# Patient Record
Sex: Male | Born: 1965 | Race: White | Hispanic: No | Smoking: Never smoker
Health system: Southern US, Community
[De-identification: ages and names within clinical notes are randomized; demographics above are authoritative.]

## PROBLEM LIST (undated history)

## (undated) DIAGNOSIS — R454 Irritability and anger: Secondary | ICD-10-CM

## (undated) DIAGNOSIS — J9819 Other pulmonary collapse: Secondary | ICD-10-CM

## (undated) DIAGNOSIS — F329 Major depressive disorder, single episode, unspecified: Secondary | ICD-10-CM

## (undated) DIAGNOSIS — F32A Depression, unspecified: Secondary | ICD-10-CM

## (undated) DIAGNOSIS — K219 Gastro-esophageal reflux disease without esophagitis: Secondary | ICD-10-CM

## (undated) HISTORY — DX: Gastro-esophageal reflux disease without esophagitis: K21.9

## (undated) HISTORY — DX: Depression, unspecified: F32.A

## (undated) HISTORY — DX: Major depressive disorder, single episode, unspecified: F32.9

## (undated) HISTORY — PX: PUNCH BIOPSY OF SKIN: SHX6390

## (undated) SURGERY — Surgical Case
Anesthesia: *Unknown

---

## 1998-12-30 ENCOUNTER — Encounter: Payer: Self-pay | Admitting: Family Medicine

## 1998-12-30 ENCOUNTER — Ambulatory Visit (HOSPITAL_COMMUNITY): Admission: RE | Admit: 1998-12-30 | Discharge: 1998-12-30 | Payer: Self-pay | Admitting: *Deleted

## 2004-10-11 ENCOUNTER — Encounter: Admission: RE | Admit: 2004-10-11 | Discharge: 2004-10-11 | Payer: Self-pay | Admitting: Family Medicine

## 2004-11-22 LAB — HM COLONOSCOPY

## 2006-10-16 ENCOUNTER — Ambulatory Visit: Payer: Self-pay | Admitting: Family Medicine

## 2007-12-19 ENCOUNTER — Ambulatory Visit: Payer: Self-pay | Admitting: Family Medicine

## 2007-12-31 ENCOUNTER — Ambulatory Visit: Payer: Self-pay | Admitting: Family Medicine

## 2009-03-01 ENCOUNTER — Ambulatory Visit: Payer: Self-pay | Admitting: Family Medicine

## 2009-03-01 ENCOUNTER — Encounter: Payer: Self-pay | Admitting: Cardiology

## 2009-03-31 ENCOUNTER — Encounter (INDEPENDENT_AMBULATORY_CARE_PROVIDER_SITE_OTHER): Payer: Self-pay | Admitting: *Deleted

## 2010-06-03 ENCOUNTER — Ambulatory Visit: Payer: Self-pay | Admitting: Family Medicine

## 2010-07-28 ENCOUNTER — Ambulatory Visit: Payer: Self-pay | Admitting: Family Medicine

## 2010-10-02 ENCOUNTER — Encounter: Payer: Self-pay | Admitting: Family Medicine

## 2010-10-12 ENCOUNTER — Ambulatory Visit: Payer: Self-pay | Admitting: Physician Assistant

## 2011-03-28 ENCOUNTER — Telehealth: Payer: Self-pay | Admitting: Family Medicine

## 2011-03-28 MED ORDER — SERTRALINE HCL 50 MG PO TABS: ORAL_TABLET | ORAL | Status: DC

## 2011-03-28 NOTE — Telephone Encounter (Signed)
Contacted [facility] said pt had a script from Sept. Last year From [id number] with 3 refills he picked up last refill June 17 th He also had gotten the one from Dr. Susann Givens on April 4 th

## 2011-03-28 NOTE — Telephone Encounter (Signed)
Medication was called in but he needs an appointment in about a month

## 2011-05-07 ENCOUNTER — Other Ambulatory Visit: Payer: Self-pay | Admitting: Family Medicine

## 2011-05-08 NOTE — Telephone Encounter (Signed)
Patient needs an appointment. Med refill was refused

## 2011-05-08 NOTE — Telephone Encounter (Signed)
Is this ok?

## 2011-05-12 ENCOUNTER — Other Ambulatory Visit: Payer: Self-pay | Admitting: Family Medicine

## 2011-05-16 ENCOUNTER — Telehealth: Payer: Self-pay

## 2011-05-16 NOTE — Telephone Encounter (Signed)
LEFT MESSAGE PT NEEDS APPT

## 2011-05-16 NOTE — Telephone Encounter (Signed)
Is this ok?

## 2011-05-16 NOTE — Telephone Encounter (Signed)
He needs an appointment

## 2011-05-29 ENCOUNTER — Encounter: Payer: Self-pay | Admitting: Family Medicine

## 2011-05-29 ENCOUNTER — Ambulatory Visit (INDEPENDENT_AMBULATORY_CARE_PROVIDER_SITE_OTHER): Payer: PRIVATE HEALTH INSURANCE | Admitting: Family Medicine

## 2011-05-29 VITALS — BP 126/76 | HR 77 | Ht 71.0 in | Wt 221.0 lb

## 2011-05-29 DIAGNOSIS — Z23 Encounter for immunization: Secondary | ICD-10-CM

## 2011-05-29 DIAGNOSIS — F341 Dysthymic disorder: Secondary | ICD-10-CM

## 2011-05-29 DIAGNOSIS — E669 Obesity, unspecified: Secondary | ICD-10-CM

## 2011-05-29 DIAGNOSIS — N433 Hydrocele, unspecified: Secondary | ICD-10-CM | POA: Insufficient documentation

## 2011-05-29 DIAGNOSIS — I10 Essential (primary) hypertension: Secondary | ICD-10-CM

## 2011-05-29 DIAGNOSIS — Z0000 Encounter for general adult medical examination without abnormal findings: Secondary | ICD-10-CM

## 2011-05-29 DIAGNOSIS — Z8719 Personal history of other diseases of the digestive system: Secondary | ICD-10-CM

## 2011-05-29 LAB — POCT URINALYSIS DIPSTICK
Bilirubin, UA: NEGATIVE
Blood, UA: NEGATIVE
Glucose, UA: NEGATIVE
Ketones, UA: NEGATIVE
Leukocytes, UA: NEGATIVE
Nitrite, UA: NEGATIVE
Protein, UA: NEGATIVE
Spec Grav, UA: 1.02
Urobilinogen, UA: NEGATIVE
pH, UA: 5

## 2011-05-29 LAB — LIPID PANEL
Cholesterol: 164 mg/dL (ref 0–200)
Triglycerides: 76 mg/dL (ref <150)
VLDL: 15 mg/dL (ref 0–40)

## 2011-05-29 LAB — CBC WITH DIFFERENTIAL/PLATELET
Eosinophils Absolute: 0.1 10*3/uL (ref 0.0–0.7)
Hemoglobin: 15.8 g/dL (ref 13.0–17.0)
Lymphocytes Relative: 30 % (ref 12–46)
Lymphs Abs: 1.9 10*3/uL (ref 0.7–4.0)
MCH: 28.9 pg (ref 26.0–34.0)
Monocytes Relative: 3 % (ref 3–12)
Neutro Abs: 4.2 10*3/uL (ref 1.7–7.7)
Neutrophils Relative %: 66 % (ref 43–77)
Platelets: 216 10*3/uL (ref 150–400)
RBC: 5.47 MIL/uL (ref 4.22–5.81)
WBC: 6.4 10*3/uL (ref 4.0–10.5)

## 2011-05-29 LAB — COMPREHENSIVE METABOLIC PANEL
ALT: 21 U/L (ref 0–53)
Albumin: 4.8 g/dL (ref 3.5–5.2)
CO2: 26 mEq/L (ref 19–32)
Calcium: 9.5 mg/dL (ref 8.4–10.5)
Chloride: 106 mEq/L (ref 96–112)
Glucose, Bld: 114 mg/dL — ABNORMAL HIGH (ref 70–99)
Potassium: 4.1 mEq/L (ref 3.5–5.3)
Sodium: 141 mEq/L (ref 135–145)
Total Protein: 7 g/dL (ref 6.0–8.3)

## 2011-05-29 MED ORDER — SERTRALINE HCL 50 MG PO TABS: ORAL_TABLET | ORAL | Status: DC

## 2011-05-29 MED ORDER — LISINOPRIL-HYDROCHLOROTHIAZIDE 10-12.5 MG PO TABS: 1.0000 | ORAL_TABLET | Freq: Every day | ORAL | Status: DC

## 2011-05-29 NOTE — Progress Notes (Signed)
  Subjective:    Patient ID: Robert Diaz, male    DOB: 15-Sep-1965, 45 y.o.   MRN: [id number]  HPI He is here for complete examination. Approximately 2 weeks ago he woke up with some back pain and is still having some difficulty with this. No history of injury. No numbness tingling or weakness. He continues on his blood pressure medicine and Zoloft. He is having no difficulty with this other than when he forgets the Zoloft, he will become more irritable. He does chew tobacco roughly once per month. His social and family history are reviewed   Review of Systems  Cardiovascular: Positive for palpitations.  Genitourinary: Positive for scrotal swelling.  Musculoskeletal: Positive for myalgias.  Hematological: Negative.   Psychiatric/Behavioral: Negative.        Objective:   Physical Exam BP 126/76  Pulse 77  Ht 5\' 11"  (1.803 m)  Wt 221 lb (100.245 kg)  BMI 30.82 kg/m2  General Appearance:    Alert, cooperative, no distress, appears stated age  Head:    Normocephalic, without obvious abnormality, atraumatic  Eyes:    PERRL, conjunctiva/corneas clear, EOM's intact, fundi    benign  Ears:    Normal TM's and external ear canals  Nose:   Nares normal, mucosa normal, no drainage or sinus   tenderness  Throat:   Lips, mucosa, and tongue normal; teeth and gums normal  Neck:   Supple, no lymphadenopathy;  thyroid:  no   enlargement/tenderness/nodules; no carotid   bruit or JVD  Back:    Spine nontender, no curvature, ROM normal, no CVA     tenderness  Lungs:     Clear to auscultation bilaterally without wheezes, rales or     ronchi; respirations unlabored  Chest Wall:    No tenderness or deformity   Heart:    Regular rate and rhythm, S1 and S2 normal, no murmur, rub   or gallop  Breast Exam:    No chest wall tenderness, masses or gynecomastia  Abdomen:     Soft, non-tender, nondistended, normoactive bowel sounds,    no masses, no hepatosplenomegaly  Genitalia:    Normal male external  genitalia without lesions.  Testicles without masses, right-sided to cm mass noted; it does transilluminate.  No inguinal hernias.  Rectal:    Normal sphincter tone, no masses or tenderness; guaiac negative stool.  Prostate smooth, no nodules, not enlarged.  Extremities:   No clubbing, cyanosis or edema  Pulses:   2+ and symmetric all extremities  Skin:   Skin color, texture, turgor normal, no rashes or lesions  Lymph nodes:   Cervical, supraclavicular, and axillary nodes normal  Neurologic:   CNII-XII intact, normal strength, sensation and gait; reflexes 2+ and symmetric throughout          Psych:   Normal mood, affect, hygiene and grooming.           Assessment & Plan:   1. Physical exam, annual  POCT Urinalysis Dipstick, POCT Occult Blood Stool, CBC w/Diff, Comprehensive metabolic panel, Lipid panel  2. Hypertension  CBC w/Diff, Comprehensive metabolic panel, Lipid panel  3. Obesity (BMI 30-39.9)    4. Hydrocele, right    5. History of gastroesophageal reflux (GERD)    6. Dysthymia     He will continue on his present medications. The hydrocele needs no further followup.

## 2011-05-30 ENCOUNTER — Telehealth: Payer: Self-pay

## 2011-05-30 NOTE — Telephone Encounter (Signed)
Called pt to let him know labs look good left message

## 2011-06-05 ENCOUNTER — Encounter: Payer: Self-pay | Admitting: Family Medicine

## 2011-06-19 ENCOUNTER — Telehealth: Payer: Self-pay | Admitting: Family Medicine

## 2011-06-19 MED ORDER — LOSARTAN POTASSIUM-HCTZ 50-12.5 MG PO TABS: 1.0000 | ORAL_TABLET | Freq: Every day | ORAL | Status: DC

## 2011-06-19 NOTE — Telephone Encounter (Signed)
Dr.Lalonde I cant find where his med was changed

## 2011-06-19 NOTE — Telephone Encounter (Signed)
Give him losartan since he has an ACE cough

## 2011-06-19 NOTE — Telephone Encounter (Signed)
Called losartan hctz 50/12.5

## 2011-07-29 ENCOUNTER — Other Ambulatory Visit: Payer: Self-pay | Admitting: Family Medicine

## 2011-07-31 NOTE — Telephone Encounter (Signed)
Is this okay to refill? 

## 2011-09-06 ENCOUNTER — Other Ambulatory Visit: Payer: Self-pay | Admitting: Family Medicine

## 2011-09-07 NOTE — Telephone Encounter (Signed)
Is this ok?

## 2012-03-11 ENCOUNTER — Ambulatory Visit (INDEPENDENT_AMBULATORY_CARE_PROVIDER_SITE_OTHER): Payer: PRIVATE HEALTH INSURANCE | Admitting: Medical

## 2012-03-11 ENCOUNTER — Encounter: Payer: Self-pay | Admitting: Medical

## 2012-03-11 VITALS — BP 138/90 | HR 82 | Temp 98.1°F | Resp 16 | Wt 218.0 lb

## 2012-03-11 DIAGNOSIS — H6690 Otitis media, unspecified, unspecified ear: Secondary | ICD-10-CM

## 2012-03-11 DIAGNOSIS — T148 Other injury of unspecified body region: Secondary | ICD-10-CM

## 2012-03-11 DIAGNOSIS — R509 Fever, unspecified: Secondary | ICD-10-CM

## 2012-03-11 DIAGNOSIS — W57XXXA Bitten or stung by nonvenomous insect and other nonvenomous arthropods, initial encounter: Secondary | ICD-10-CM

## 2012-03-11 DIAGNOSIS — T148XXA Other injury of unspecified body region, initial encounter: Secondary | ICD-10-CM

## 2012-03-11 MED ORDER — DOXYCYCLINE HYCLATE 100 MG PO TABS: 100.0000 mg | ORAL_TABLET | Freq: Two times a day (BID) | ORAL | Status: AC

## 2012-03-11 NOTE — Progress Notes (Signed)
Subjective: Not feeling well.  Feeling sick last few days, feeling weak and tired.  He notes tick bite on right side a week ago, not sure if this is related.  Been having flu like symptoms.  Been having cough, headaches that started today.  Brother lives with him and he feels a little sick too.    ROS Gen: felt feverish last night, sweats, +fatigue, but no recent weight loss Skin: no rash other than slight irritation at tick site Heent: +decreased appetite, no ear pain, no sore throat GI: no belly pain, NVD GU: negative  Objective: Gen: ill appearing, flushed in face, wd, wn, white male Skin: flushed appearing, somewhat diaphoretic HENT: TMs bilat with erythema, nares with some congestion, otherwise non tender sinus, pharynx normal appearing Lungs: clear Heart: rrr, no murmur  Assessment: Encounter Diagnoses  Name Primary?  . Otitis media Yes  . Fever   . Tick bite    Plan: Doxycyline, rest, hydrate well, OTC Ibuprofen and Mucinex.  Call if not improving.  Note for work.

## 2012-06-25 ENCOUNTER — Other Ambulatory Visit: Payer: Self-pay | Admitting: Family Medicine

## 2012-06-25 NOTE — Telephone Encounter (Signed)
Zoloft renewed for 6 months 

## 2012-06-25 NOTE — Telephone Encounter (Signed)
RX REFILL FOR ZOLOFT.

## 2012-06-26 ENCOUNTER — Other Ambulatory Visit: Payer: Self-pay | Admitting: Family Medicine

## 2012-07-11 ENCOUNTER — Other Ambulatory Visit: Payer: Self-pay | Admitting: Family Medicine

## 2013-01-19 ENCOUNTER — Other Ambulatory Visit: Payer: Self-pay | Admitting: Family Medicine

## 2013-01-20 ENCOUNTER — Other Ambulatory Visit: Payer: Self-pay | Admitting: Medical

## 2013-01-20 MED ORDER — SERTRALINE HCL 50 MG PO TABS: ORAL_TABLET | ORAL | Status: DC

## 2013-01-20 NOTE — Telephone Encounter (Signed)
Shane, Can I refill this?

## 2013-01-20 NOTE — Telephone Encounter (Signed)
i sent 30 day supply, needs OV

## 2013-03-30 ENCOUNTER — Other Ambulatory Visit: Payer: Self-pay | Admitting: Medical

## 2013-04-03 ENCOUNTER — Other Ambulatory Visit: Payer: Self-pay | Admitting: Medical

## 2013-04-10 ENCOUNTER — Other Ambulatory Visit: Payer: Self-pay | Admitting: Family Medicine

## 2013-04-10 ENCOUNTER — Ambulatory Visit (INDEPENDENT_AMBULATORY_CARE_PROVIDER_SITE_OTHER): Payer: PRIVATE HEALTH INSURANCE | Admitting: Family Medicine

## 2013-04-10 ENCOUNTER — Encounter: Payer: Self-pay | Admitting: Family Medicine

## 2013-04-10 VITALS — BP 140/90 | HR 86 | Wt 219.0 lb

## 2013-04-10 DIAGNOSIS — F341 Dysthymic disorder: Secondary | ICD-10-CM

## 2013-04-10 DIAGNOSIS — I10 Essential (primary) hypertension: Secondary | ICD-10-CM

## 2013-04-10 DIAGNOSIS — E669 Obesity, unspecified: Secondary | ICD-10-CM

## 2013-04-10 DIAGNOSIS — Z79899 Other long term (current) drug therapy: Secondary | ICD-10-CM

## 2013-04-10 DIAGNOSIS — Z566 Other physical and mental strain related to work: Secondary | ICD-10-CM

## 2013-04-10 LAB — HEMOCCULT GUIAC POC 1CARD (OFFICE)

## 2013-04-10 LAB — CBC WITH DIFFERENTIAL/PLATELET
Eosinophils Relative: 1 % (ref 0–5)
HCT: 46.9 % (ref 39.0–52.0)
Lymphocytes Relative: 32 % (ref 12–46)
Lymphs Abs: 2.3 10*3/uL (ref 0.7–4.0)
MCV: 84.5 fL (ref 78.0–100.0)
Neutro Abs: 4.3 10*3/uL (ref 1.7–7.7)
Platelets: 224 10*3/uL (ref 150–400)
RBC: 5.55 MIL/uL (ref 4.22–5.81)
WBC: 7.2 10*3/uL (ref 4.0–10.5)

## 2013-04-10 LAB — COMPREHENSIVE METABOLIC PANEL
ALT: 34 U/L (ref 0–53)
Albumin: 5.3 g/dL — ABNORMAL HIGH (ref 3.5–5.2)
CO2: 26 mEq/L (ref 19–32)
Calcium: 10.3 mg/dL (ref 8.4–10.5)
Chloride: 104 mEq/L (ref 96–112)
Creat: 0.81 mg/dL (ref 0.50–1.35)
Potassium: 3.9 mEq/L (ref 3.5–5.3)
Total Protein: 7.5 g/dL (ref 6.0–8.3)

## 2013-04-10 LAB — LIPID PANEL
Cholesterol: 188 mg/dL (ref 0–200)
Total CHOL/HDL Ratio: 5.1 Ratio

## 2013-04-10 MED ORDER — LOSARTAN POTASSIUM-HCTZ 50-12.5 MG PO TABS
ORAL_TABLET | ORAL | Status: DC
Start: 2013-04-10 — End: 2013-11-25

## 2013-04-10 MED ORDER — SERTRALINE HCL 50 MG PO TABS: ORAL_TABLET | ORAL | Status: DC

## 2013-04-10 NOTE — Progress Notes (Signed)
  Subjective:    Patient ID: Robert Diaz, male    DOB: 1966-01-19, 47 y.o.   MRN: [id number]  HPI He is here for medication check. He continues on his blood pressure medication and is having no difficulty with this. He did stop taking his Zoloft he had been taking for an underlying anxiety and dysthymia issue. He does note that he is having increased in irritability especially with work related circumstances. Work seems to be his major stressor. He has no other concerns or complaints.   Review of Systems     Objective:   Physical Exam alert and in no distress. Tympanic membranes and canals are normal. Throat is clear. Tonsils are normal. Neck is supple without adenopathy or thyromegaly. Cardiac exam shows a regular sinus rhythm without murmurs or gallops. Lungs are clear to auscultation. Rectal shows a normal prostate with guaiac-negative stool.       Assessment & Plan:  Dysthymia - Plan: sertraline (ZOLOFT) 50 MG tablet  Hypertension - Plan: CBC with Differential, Comprehensive metabolic panel, losartan-hydrochlorothiazide (HYZAAR) 50-12.5 MG per tablet  Obesity (BMI 30-39.9) - Plan: CBC with Differential, Comprehensive metabolic panel  Encounter for long-term (current) use of other medications - Plan: CBC with Differential, Comprehensive metabolic panel, Lipid panel, Hemoccult - 1 Card (office)  Work-related stress  discussed placing her back on Zoloft which he would like to do. We also discussed stress and stress management. I did mention counseling to him. Encouraged him to be a little bit easier on himself since he does expect perfection out of himself and others. Continue his blood pressure medication.

## 2013-04-11 LAB — HEMOGLOBIN A1C: Hgb A1c MFr Bld: 5.5 % (ref <5.7)

## 2013-08-11 ENCOUNTER — Encounter: Payer: Self-pay | Admitting: Family Medicine

## 2013-08-11 ENCOUNTER — Ambulatory Visit (INDEPENDENT_AMBULATORY_CARE_PROVIDER_SITE_OTHER): Payer: PRIVATE HEALTH INSURANCE | Admitting: Family Medicine

## 2013-08-11 VITALS — BP 136/84 | HR 87 | Wt 219.0 lb

## 2013-08-11 DIAGNOSIS — T169XXA Foreign body in ear, unspecified ear, initial encounter: Secondary | ICD-10-CM

## 2013-08-11 DIAGNOSIS — T161XXA Foreign body in right ear, initial encounter: Secondary | ICD-10-CM

## 2013-08-11 NOTE — Progress Notes (Signed)
   Subjective:    Patient ID: Robert Diaz, male    DOB: Mar 13, 1966, 47 y.o.   MRN: [id number]  HPI He was attempting to clean wax from his right ear with a cotton swab and got stuck. He then forced it back further into the canal.   Review of Systems     Objective:   Physical Exam Exam of the right canal does show a foreign body. It was removed with alligator forceps. The TM appeared normal.       Assessment & Plan:  Foreign body of right ear, initial encounter  cautioned him on the use of Q-tips in his ears. Told him to avoid doing that.

## 2013-11-25 ENCOUNTER — Other Ambulatory Visit: Payer: Self-pay | Admitting: Family Medicine

## 2014-01-09 ENCOUNTER — Other Ambulatory Visit: Payer: Self-pay | Admitting: Family Medicine

## 2014-01-09 MED ORDER — LOSARTAN POTASSIUM-HCTZ 50-12.5 MG PO TABS: ORAL_TABLET | ORAL | Status: DC

## 2014-01-09 MED ORDER — SERTRALINE HCL 50 MG PO TABS: ORAL_TABLET | ORAL | Status: DC

## 2014-01-09 NOTE — Telephone Encounter (Signed)
Needs refill on hyzaar and zoloft

## 2014-03-02 ENCOUNTER — Encounter: Payer: Self-pay | Admitting: Family Medicine

## 2014-03-02 ENCOUNTER — Ambulatory Visit (INDEPENDENT_AMBULATORY_CARE_PROVIDER_SITE_OTHER): Payer: PRIVATE HEALTH INSURANCE | Admitting: Family Medicine

## 2014-03-02 VITALS — BP 180/110 | HR 80 | Ht 71.0 in | Wt 218.0 lb

## 2014-03-02 DIAGNOSIS — H9201 Otalgia, right ear: Secondary | ICD-10-CM

## 2014-03-02 DIAGNOSIS — T169XXA Foreign body in ear, unspecified ear, initial encounter: Secondary | ICD-10-CM

## 2014-03-02 DIAGNOSIS — T161XXA Foreign body in right ear, initial encounter: Secondary | ICD-10-CM

## 2014-03-02 DIAGNOSIS — IMO0002 Reserved for concepts with insufficient information to code with codable children: Secondary | ICD-10-CM

## 2014-03-02 DIAGNOSIS — I10 Essential (primary) hypertension: Secondary | ICD-10-CM

## 2014-03-02 DIAGNOSIS — H9209 Otalgia, unspecified ear: Secondary | ICD-10-CM

## 2014-03-02 MED ORDER — NEOMYCIN-POLYMYXIN-HC 3.5-10000-1 OT SOLN: 4.0000 [drp] | Freq: Four times a day (QID) | OTIC | Status: DC

## 2014-03-02 NOTE — Progress Notes (Signed)
Chief Complaint  Patient presents with  . Foreign Body in Ear    beetle flew in his right ear Sat night, did kill the bug but it is still in his ear.    Sunday morning (3am early yest morning), while outside at the firepit, he had a beetle fly into his right ear.  He went into the shower and "drowned it".  He doesn't feel any movement, but has some plugging and pressure.  He describes it as irritation, not pain.  He tried lavaging the ear, using peroxide, but wasn't able to get it the bug out.  Fiance mentioned he felt hot this morning, no known fevers, chills.  No cough, shortness of breath, sore throat or sinus problems.  He hasn't taken his BP medication today.  He denies headache, chest pain.  Past Medical History  Diagnosis Date  . Depression   . GERD (gastroesophageal reflux disease)    History reviewed. No pertinent past surgical history. History   Social History  . Marital Status: Divorced    Spouse Name: N/A    Number of Children: N/A  . Years of Education: N/A   Occupational History  . Not on file.   Social History Main Topics  . Smoking status: Never Smoker   . Smokeless tobacco: Current User  . Alcohol Use: 4.2 oz/week    7 Cans of beer per week     Comment: consumption is irregular  . Drug Use: Yes    Special: Marijuana  . Sexual Activity: Yes   Other Topics Concern  . Not on file   Social History Narrative  . No narrative on file   Current Outpatient Prescriptions on File Prior to Visit  Medication Sig Dispense Refill  . losartan-hydrochlorothiazide (HYZAAR) 50-12.5 MG per tablet TAKE 1 TABLET BY MOUTH DAILY.  90 tablet  3  . sertraline (ZOLOFT) 50 MG tablet TAKE 1 & 1/2 TABLETS BY MOUTH DAILY  45 tablet  1   No current facility-administered medications on file prior to visit.   No Known Allergies  ROS:  No known fevers, chills, URI symptoms, cough, shortness of breath, chest pain, rash.  No nausea, vomiting  PHYSICAL EXAM: BP 180/110  Pulse 80   Ht 5\' 11"  (1.803 m)  Wt 218 lb (98.884 kg)  BMI 30.42 kg/m2 158/102 on RA with large cuff, on repeat after ear lavage. Well developed, pleasant male in no distress R TM appears normal.  Canal was swollen/inflamed inferiorly and posteriorly.  There is some yellow-appearing cerumen posteriorly.  No foreign body noted at first. Upon ear lavage a light yellow body of a bug came out, along with some cerumen.  The posterior wall of EAC now appears normal.  There is still inflammation of posterior and inferior walls of the canal.  He has some tenderness on movement of external ear. Neck: no lymphadenopathy  ASSESSMENT/PLAN:  Foreign body in right ear, initial encounter - insect--removed with lavage - Plan: PR REMV EXT CANAL FOREIGN BODY  Right ear pain - inflammation of canal noted (?related to insect or the treatments he did to himself since then) - Plan: neomycin-polymyxin-hydrocortisone (CORTISPORIN) otic solution  Essential hypertension, benign - high today; hasn't taken meds yet today, and is in pain   Foreign body of right ear, s/p successful removal with lavage. Ongoing irritation of canal with discomfort, therefore will treat with ABX containing hydrocortisone  HTN--elevated today, likely related to pain and noncompliance with meds. Take meds regularly

## 2014-03-02 NOTE — Patient Instructions (Signed)
Your blood pressure was high today.  Probably partly due to pain, and partly related to the fact that you didn't take your medication today.  Please take it when you get home.  Monitor your blood pressure periodically to make sure that it comes back down to the normal range.

## 2014-03-11 ENCOUNTER — Other Ambulatory Visit: Payer: Self-pay | Admitting: Family Medicine

## 2014-04-12 ENCOUNTER — Other Ambulatory Visit: Payer: Self-pay | Admitting: Family Medicine

## 2014-04-20 ENCOUNTER — Other Ambulatory Visit: Payer: Self-pay | Admitting: Family Medicine

## 2014-05-30 ENCOUNTER — Other Ambulatory Visit: Payer: Self-pay | Admitting: Family Medicine

## 2014-06-01 NOTE — Telephone Encounter (Signed)
DR.LALONDE IS THIS OKAY 

## 2014-06-01 NOTE — Telephone Encounter (Signed)
I HAVE CALLED 3 TIMES NOW AND WILL REFUSE MED

## 2014-06-01 NOTE — Telephone Encounter (Signed)
Have him set up an appointment and renew his medication.

## 2014-06-09 ENCOUNTER — Telehealth: Payer: Self-pay | Admitting: Internal Medicine

## 2014-06-09 MED ORDER — SERTRALINE HCL 50 MG PO TABS: ORAL_TABLET | ORAL | Status: DC

## 2014-06-09 NOTE — Telephone Encounter (Signed)
Pt scheduled an appt for October 14th with Dr. Redmond School. And i will refill his sertaline for 30 days

## 2014-06-24 ENCOUNTER — Encounter: Payer: PRIVATE HEALTH INSURANCE | Admitting: Family Medicine

## 2014-07-06 ENCOUNTER — Other Ambulatory Visit: Payer: Self-pay | Admitting: Family Medicine

## 2014-07-06 ENCOUNTER — Encounter: Payer: PRIVATE HEALTH INSURANCE | Admitting: Family Medicine

## 2014-07-23 ENCOUNTER — Encounter: Payer: Self-pay | Admitting: Family Medicine

## 2014-07-23 ENCOUNTER — Ambulatory Visit (INDEPENDENT_AMBULATORY_CARE_PROVIDER_SITE_OTHER): Payer: PRIVATE HEALTH INSURANCE | Admitting: Family Medicine

## 2014-07-23 VITALS — BP 122/80 | HR 78 | Wt 215.0 lb

## 2014-07-23 DIAGNOSIS — F341 Dysthymic disorder: Secondary | ICD-10-CM

## 2014-07-23 DIAGNOSIS — B351 Tinea unguium: Secondary | ICD-10-CM

## 2014-07-23 DIAGNOSIS — Z23 Encounter for immunization: Secondary | ICD-10-CM

## 2014-07-23 DIAGNOSIS — E669 Obesity, unspecified: Secondary | ICD-10-CM | POA: Diagnosis not present

## 2014-07-23 DIAGNOSIS — I10 Essential (primary) hypertension: Secondary | ICD-10-CM | POA: Diagnosis not present

## 2014-07-23 DIAGNOSIS — Z8719 Personal history of other diseases of the digestive system: Secondary | ICD-10-CM

## 2014-07-23 MED ORDER — SERTRALINE HCL 50 MG PO TABS: 50.0000 mg | ORAL_TABLET | Freq: Every day | ORAL | Status: DC

## 2014-07-23 MED ORDER — LOSARTAN POTASSIUM-HCTZ 50-12.5 MG PO TABS: ORAL_TABLET | ORAL | Status: DC

## 2014-07-23 MED ORDER — TERBINAFINE HCL 250 MG PO TABS: 250.0000 mg | ORAL_TABLET | Freq: Every day | ORAL | Status: DC

## 2014-07-23 NOTE — Progress Notes (Signed)
Subjective:     Patient ID: Robert Diaz, male   DOB: [birth year], 48 y.o.   MRN: [id number]  HPI   This is a 48 yo male with a history of HTN, dysthymia, and obesity who prevents for a health maintenance visit.  He reports that his mood is good, and he is experiencing minimal side effects from sertraline.  He has been feeling anxious since letting his prescription lapse for 30 days.he continues on his blood pressure medication and is having no difficulty.  He would like a flu shot today.  He reports having a colonoscopy due to hematochezia two years ago which showed polyps (type unspecified).  He is not currently taking a PPI for his history of GERD.  He is concerned due to nail bed changes of his R ring and small fingers.   Review of Systems  Denies headaches, shortness of breath, chest pain.     Objective:   Physical Exam   General: Pleasant man in no acute distress. CV: RRRnormal S1 and S2. No murmurs or gallops. Pulm: clear to auscultation. Extremities: thickening and shortening or ring and small finger on the R hand with hyperkeratinosis.  Assessment & Plan  Essential hypertension - Patients blood pressure well controlled at today's visit.  Continue with Losartan/HCTZ & f/u in 6 months. - Plan: losartan-hydrochlorothiazide (HYZAAR) 50-12.5 MG per tablet  Obesity (BMI 30-39.9) - Encouraged patient to consider lifestyle changes including exercise and dietary modification.  Dysthymia - Encouraged patient not to allow SSRI to lapse.  Mood stable.  Continue Seratline 50 mg daily w/ f/u in 6 months. - Plan: sertraline (ZOLOFT) 50 MG tablet  History of gastroesophageal reflux (GERD)  Need for prophylactic vaccination and inoculation against influenza - Plan: Flu Vaccine QUAD 36+ mos IM  Onychomycosis - Skin changes consistent with onychomycosis.  Continue oral terbinafine for 3 months. - Plan: terbinafine (LAMISIL) 250 MG tablet encouraged him to take a PPI as needed for reflux  symptoms. Also discussed treatment of his onychomycosis. Explained that he needs uses for 3 months and the success rate is not 100% nor is total cure likely as this does recur -  C. Albertha Ghee, MS3, [facility] Seen and examined in conjunction with Dr. Denita Lung, MD

## 2014-08-19 ENCOUNTER — Other Ambulatory Visit: Payer: Self-pay | Admitting: Family Medicine

## 2014-08-19 NOTE — Telephone Encounter (Signed)
Is this okay to refill? 

## 2014-08-19 NOTE — Telephone Encounter (Signed)
This should have been taking care of in November so I'm not sure why it showing up in my chart

## 2014-08-20 NOTE — Telephone Encounter (Signed)
Is this okay?

## 2014-08-31 ENCOUNTER — Ambulatory Visit (INDEPENDENT_AMBULATORY_CARE_PROVIDER_SITE_OTHER): Payer: PRIVATE HEALTH INSURANCE | Admitting: Family Medicine

## 2014-08-31 ENCOUNTER — Encounter: Payer: Self-pay | Admitting: Family Medicine

## 2014-08-31 ENCOUNTER — Other Ambulatory Visit: Payer: Self-pay | Admitting: Family Medicine

## 2014-08-31 VITALS — BP 122/90 | HR 90 | Wt 234.0 lb

## 2014-08-31 DIAGNOSIS — K625 Hemorrhage of anus and rectum: Secondary | ICD-10-CM

## 2014-08-31 LAB — COMPREHENSIVE METABOLIC PANEL
ALBUMIN: 4.5 g/dL (ref 3.5–5.2)
ALT: 23 U/L (ref 0–53)
AST: 16 U/L (ref 0–37)
Alkaline Phosphatase: 83 U/L (ref 39–117)
BUN: 12 mg/dL (ref 6–23)
CALCIUM: 9.4 mg/dL (ref 8.4–10.5)
CO2: 28 mEq/L (ref 19–32)
Chloride: 105 mEq/L (ref 96–112)
Creat: 0.87 mg/dL (ref 0.50–1.35)
Glucose, Bld: 122 mg/dL — ABNORMAL HIGH (ref 70–99)
POTASSIUM: 4 meq/L (ref 3.5–5.3)
SODIUM: 140 meq/L (ref 135–145)
TOTAL PROTEIN: 6.8 g/dL (ref 6.0–8.3)
Total Bilirubin: 0.4 mg/dL (ref 0.2–1.2)

## 2014-08-31 LAB — HEMOCCULT GUIAC POC 1CARD (OFFICE)

## 2014-08-31 LAB — CBC WITH DIFFERENTIAL/PLATELET
Basophils Absolute: 0.1 10*3/uL (ref 0.0–0.1)
Basophils Relative: 1 % (ref 0–1)
EOS ABS: 0.1 10*3/uL (ref 0.0–0.7)
Eosinophils Relative: 1 % (ref 0–5)
HEMATOCRIT: 42.8 % (ref 39.0–52.0)
Hemoglobin: 14.7 g/dL (ref 13.0–17.0)
LYMPHS ABS: 2.5 10*3/uL (ref 0.7–4.0)
Lymphocytes Relative: 40 % (ref 12–46)
MCH: 29.5 pg (ref 26.0–34.0)
MCHC: 34.3 g/dL (ref 30.0–36.0)
MCV: 85.9 fL (ref 78.0–100.0)
MONO ABS: 0.4 10*3/uL (ref 0.1–1.0)
MPV: 8.4 fL — AB (ref 9.4–12.4)
Monocytes Relative: 7 % (ref 3–12)
Neutro Abs: 3.2 10*3/uL (ref 1.7–7.7)
Neutrophils Relative %: 51 % (ref 43–77)
Platelets: 214 10*3/uL (ref 150–400)
RBC: 4.98 MIL/uL (ref 4.22–5.81)
RDW: 14 % (ref 11.5–15.5)
WBC: 6.3 10*3/uL (ref 4.0–10.5)

## 2014-08-31 NOTE — Progress Notes (Signed)
   Subjective:    Patient ID: Robert Diaz, male    DOB: [birth year], 48 y.o.   MRN: [id number]  HPI He notes difficulty with bright red blood per rectum intermittently for an unknown period of time. He notes no abdominal pain or pain with having a BM. He has not felt any lesions in the anal area. His wife is here with him to keep him on this. He does have a previous history of colon polyps. I do not have the report in his record.   Review of Systems     Objective:   Physical Exam Anal exam shows redundant tissue but no evidence of hemorrhoid. No fissure noted. Stool was brown and guaiac-positive. No internal lesions were palpable. Stool is guaiac positive.       Assessment & Plan:  Rectal bleeding - Plan: CBC with Differential, Comprehensive metabolic panel, Ambulatory referral to Gastroenterology, POCT occult blood stool  he and his wife both describe a fair amount of bleeding and he does have a BM. I will refer to GI to further evaluate this.

## 2014-09-01 LAB — HEMOGLOBIN A1C
HEMOGLOBIN A1C: 6 % — AB (ref <5.7)
Mean Plasma Glucose: 126 mg/dL — ABNORMAL HIGH (ref <117)

## 2014-09-09 ENCOUNTER — Ambulatory Visit (INDEPENDENT_AMBULATORY_CARE_PROVIDER_SITE_OTHER): Payer: PRIVATE HEALTH INSURANCE | Admitting: Family Medicine

## 2014-09-09 DIAGNOSIS — R7302 Impaired glucose tolerance (oral): Secondary | ICD-10-CM

## 2014-09-09 DIAGNOSIS — E669 Obesity, unspecified: Secondary | ICD-10-CM | POA: Diagnosis not present

## 2014-09-09 NOTE — Progress Notes (Signed)
   Subjective:    Patient ID: Robert Diaz, male    DOB: [birth year], 48 y.o.   MRN: [id number]  HPI  he is here for consult concerning recent blood work which did show an elevated blood sugar and subsequently an A1c of 6.0.   Review of Systems     Objective:   Physical Exam  alert and in no distress otherwise not examined       Assessment & Plan:  Glucose intolerance (impaired glucose tolerance)  Obesity (BMI 30-39.9)  I discussed glucose intolerance in regard to risk for diabetes , heart disease , blindness, stroke, renal failure and peripheral neurologic problems. Scuffs the need for him to make diet and exercise changes and make them permanently. His wife was present during the discussion. Recommend he get his waist size down to a true 34. Discussed follow-up. He will return here in 6 months or possibly sooner based on his need for monitoring.

## 2014-09-10 ENCOUNTER — Encounter: Payer: Self-pay | Admitting: Family Medicine

## 2014-09-14 ENCOUNTER — Encounter: Payer: Self-pay | Admitting: Internal Medicine

## 2014-10-23 ENCOUNTER — Encounter: Payer: Self-pay | Admitting: Family Medicine

## 2015-08-05 ENCOUNTER — Other Ambulatory Visit: Payer: Self-pay | Admitting: Family Medicine

## 2015-08-09 NOTE — Telephone Encounter (Signed)
Is this okay?

## 2015-08-16 ENCOUNTER — Encounter: Payer: Self-pay | Admitting: Family Medicine

## 2015-08-16 ENCOUNTER — Ambulatory Visit (INDEPENDENT_AMBULATORY_CARE_PROVIDER_SITE_OTHER): Payer: PRIVATE HEALTH INSURANCE | Admitting: Family Medicine

## 2015-08-16 VITALS — BP 132/88 | HR 76 | Temp 98.1°F | Ht 71.0 in | Wt 218.4 lb

## 2015-08-16 DIAGNOSIS — F341 Dysthymic disorder: Secondary | ICD-10-CM | POA: Diagnosis not present

## 2015-08-16 DIAGNOSIS — E669 Obesity, unspecified: Secondary | ICD-10-CM

## 2015-08-16 DIAGNOSIS — I10 Essential (primary) hypertension: Secondary | ICD-10-CM | POA: Diagnosis not present

## 2015-08-16 DIAGNOSIS — R7302 Impaired glucose tolerance (oral): Secondary | ICD-10-CM | POA: Diagnosis not present

## 2015-08-16 DIAGNOSIS — Z23 Encounter for immunization: Secondary | ICD-10-CM | POA: Diagnosis not present

## 2015-08-16 DIAGNOSIS — J069 Acute upper respiratory infection, unspecified: Secondary | ICD-10-CM

## 2015-08-16 LAB — LIPID PANEL
Cholesterol: 159 mg/dL (ref 125–200)
HDL: 48 mg/dL (ref >=40)
LDL CALC: 98 mg/dL (ref <130)
Total CHOL/HDL Ratio: 3.3 Ratio (ref <=5.0)
Triglycerides: 65 mg/dL (ref <150)
VLDL: 13 mg/dL (ref <30)

## 2015-08-16 LAB — CBC WITH DIFFERENTIAL/PLATELET
Basophils Absolute: 0.1 10*3/uL (ref 0.0–0.1)
Basophils Relative: 1 % (ref 0–1)
EOS ABS: 0.1 10*3/uL (ref 0.0–0.7)
Eosinophils Relative: 2 % (ref 0–5)
HEMATOCRIT: 47.4 % (ref 39.0–52.0)
Hemoglobin: 16.1 g/dL (ref 13.0–17.0)
LYMPHS PCT: 34 % (ref 12–46)
Lymphs Abs: 2.1 10*3/uL (ref 0.7–4.0)
MCH: 29.1 pg (ref 26.0–34.0)
MCHC: 34 g/dL (ref 30.0–36.0)
MCV: 85.6 fL (ref 78.0–100.0)
MONO ABS: 0.4 10*3/uL (ref 0.1–1.0)
MPV: 8.5 fL — ABNORMAL LOW (ref 8.6–12.4)
Monocytes Relative: 6 % (ref 3–12)
Neutro Abs: 3.5 10*3/uL (ref 1.7–7.7)
Neutrophils Relative %: 57 % (ref 43–77)
Platelets: 213 10*3/uL (ref 150–400)
RBC: 5.54 MIL/uL (ref 4.22–5.81)
RDW: 13.7 % (ref 11.5–15.5)
WBC: 6.1 10*3/uL (ref 4.0–10.5)

## 2015-08-16 LAB — COMPREHENSIVE METABOLIC PANEL
ALT: 25 U/L (ref 9–46)
AST: 19 U/L (ref 10–35)
Albumin: 4.8 g/dL (ref 3.6–5.1)
Alkaline Phosphatase: 83 U/L (ref 40–115)
BILIRUBIN TOTAL: 0.5 mg/dL (ref 0.2–1.2)
BUN: 14 mg/dL (ref 7–25)
CO2: 28 mmol/L (ref 20–31)
CREATININE: 0.84 mg/dL (ref 0.70–1.33)
Calcium: 9.6 mg/dL (ref 8.6–10.3)
Chloride: 104 mmol/L (ref 98–110)
GLUCOSE: 98 mg/dL (ref 65–99)
Potassium: 4.4 mmol/L (ref 3.5–5.3)
Sodium: 140 mmol/L (ref 135–146)
Total Protein: 7.1 g/dL (ref 6.1–8.1)

## 2015-08-16 LAB — POCT GLYCOSYLATED HEMOGLOBIN (HGB A1C): HEMOGLOBIN A1C: 5.8

## 2015-08-16 MED ORDER — SERTRALINE HCL 50 MG PO TABS: ORAL_TABLET | ORAL | Status: DC

## 2015-08-16 MED ORDER — LOSARTAN POTASSIUM-HCTZ 50-12.5 MG PO TABS: 1.0000 | ORAL_TABLET | Freq: Every day | ORAL | Status: DC

## 2015-08-16 MED ORDER — AZITHROMYCIN 500 MG PO TABS: 500.0000 mg | ORAL_TABLET | Freq: Every day | ORAL | Status: DC

## 2015-08-16 NOTE — Patient Instructions (Signed)
Use the serenity prayer. As a corollary to the serenity prayer in that it might be something you can change but you have to decide whether it's worth it

## 2015-08-16 NOTE — Progress Notes (Signed)
   Subjective:    Patient ID: Robert Diaz, male    DOB: 22-Jun-1966, 49 y.o.   MRN: [id number]  HPI he is here for a follow-up visit. He has made no real changes in his lifestyle in regard to diet and exercise. He states his work keeps him quite busy working 12 hours per day. He continues on his Zoloft which is helping him with his mood. He admits that he is a perfectionist which interferes with his interaction with people. He continues on his blood pressure medication without difficulty. He has had difficulty over the last 10 days with nasal and chest congestion, intermittent lipid call per no fever, chills, sore throat. He does not smoke and has no underlying allergies. He did have a colonoscopy earlier this year. Review of Systems     Objective:   Physical Exam Alert and in no distress. Tympanic membranes and canals are normal. Pharyngeal area is normal. Neck is supple without adenopathy or thyromegaly. Cardiac exam shows a regular sinus rhythm without murmurs or gallops. Lungs are clear to auscultation.  Hemoglobin A1c is 5.8      Assessment & Plan:  Glucose intolerance (impaired glucose tolerance) - Plan: CBC with Differential/Platelet, Comprehensive metabolic panel, Lipid panel, HgB A1c  Dysthymia  Essential hypertension - Plan: CBC with Differential/Platelet, Comprehensive metabolic panel, Lipid panel  Obesity (BMI 30-39.9)  URI (upper respiratory infection) - Plan: azithromycin (ZITHROMAX) 500 MG tablet  Need for prophylactic vaccination and inoculation against influenza - Plan: Flu Vaccine QUAD 36+ mos IM  I explained that he still has glucose intolerance and again recommended further diet and exercise changes. Specifically talked to him about increasing his physical activity and trying to work it in his work day. Also recommend he wait a couple of days before starting antibiotic to see if he turns the corner. Over 25 minutes, greater than 50% spent in counseling and  coordination of care.

## 2015-08-18 ENCOUNTER — Emergency Department (HOSPITAL_COMMUNITY)
Admission: EM | Admit: 2015-08-18 | Discharge: 2015-08-18 | Disposition: A | Discharge status: Home / Self Care | Payer: PRIVATE HEALTH INSURANCE | Attending: Emergency Medicine | Admitting: Emergency Medicine

## 2015-08-18 ENCOUNTER — Encounter (HOSPITAL_COMMUNITY): Payer: Self-pay

## 2015-08-18 ENCOUNTER — Emergency Department (HOSPITAL_COMMUNITY): Payer: PRIVATE HEALTH INSURANCE

## 2015-08-18 DIAGNOSIS — R079 Chest pain, unspecified: Secondary | ICD-10-CM | POA: Diagnosis not present

## 2015-08-18 DIAGNOSIS — Z8719 Personal history of other diseases of the digestive system: Secondary | ICD-10-CM | POA: Diagnosis not present

## 2015-08-18 DIAGNOSIS — F329 Major depressive disorder, single episode, unspecified: Secondary | ICD-10-CM | POA: Diagnosis not present

## 2015-08-18 DIAGNOSIS — R509 Fever, unspecified: Secondary | ICD-10-CM | POA: Diagnosis not present

## 2015-08-18 DIAGNOSIS — R05 Cough: Secondary | ICD-10-CM | POA: Diagnosis not present

## 2015-08-18 DIAGNOSIS — R0981 Nasal congestion: Secondary | ICD-10-CM | POA: Insufficient documentation

## 2015-08-18 DIAGNOSIS — Z792 Long term (current) use of antibiotics: Secondary | ICD-10-CM | POA: Insufficient documentation

## 2015-08-18 DIAGNOSIS — Z79899 Other long term (current) drug therapy: Secondary | ICD-10-CM | POA: Diagnosis not present

## 2015-08-18 DIAGNOSIS — Z8709 Personal history of other diseases of the respiratory system: Secondary | ICD-10-CM | POA: Diagnosis not present

## 2015-08-18 HISTORY — DX: Irritability and anger: R45.4

## 2015-08-18 HISTORY — DX: Other pulmonary collapse: J98.19

## 2015-08-18 MED ORDER — ACETAMINOPHEN 500 MG PO TABS
1000.0000 mg | ORAL_TABLET | Freq: Once | ORAL | Status: AC
  Administered 2015-08-18: 1000 mg via ORAL
  Filled 2015-08-18: qty 2

## 2015-08-18 MED ORDER — IBUPROFEN 800 MG PO TABS
800.0000 mg | ORAL_TABLET | Freq: Once | ORAL | Status: AC
  Administered 2015-08-18: 800 mg via ORAL
  Filled 2015-08-18: qty 1

## 2015-08-18 NOTE — ED Notes (Signed)
PT DISCHARGED. INSTRUCTIONS GIVEN. AAOX3. PT IN NO APPARENT DISTRESS OR PAIN. THE OPPORTUNITY TO ASK QUESTIONS WAS PROVIDED. 

## 2015-08-18 NOTE — ED Notes (Signed)
Patient c/o mid chest pain that has been intermittent x 2 weeks. Patient states that he was seen by his PCP and was given antibiotics prescription. Patient states he has been having a productive cough with gray sputum. Patient states he was concerned because the chest pain was continuing. Patient states he did not start the antibiotics.

## 2015-08-18 NOTE — ED Provider Notes (Signed)
CSN: [id number]     Arrival date & time 08/18/15  1342 History   First MD Initiated Contact with Patient 08/18/15 1628     Chief Complaint  Patient presents with  . Chest Pain     (Consider location/radiation/quality/duration/timing/severity/associated sxs/prior Treatment) Patient is a 49 y.o. male presenting with chest pain. The history is provided by the patient.  Chest Pain Pain location:  Substernal area Pain quality: sharp and shooting   Pain radiates to:  Does not radiate Pain radiates to the back: no   Pain severity:  Moderate Onset quality:  Sudden Duration:  2 weeks Timing:  Constant Progression:  Worsening Chronicity:  New Context: movement   Relieved by:  Nothing Worsened by:  Coughing and movement (palpation) Ineffective treatments:  None tried Associated symptoms: cough   Associated symptoms: no abdominal pain, no fever, no headache, no palpitations, no shortness of breath and not vomiting   Risk factors: no coronary artery disease, no diabetes mellitus, no high cholesterol, no hypertension and no smoking    49 yo M with a chief complaint chest pain. This been going on for the past couple weeks. Patient has been coughing having congestion subjective fevers and chills. Pain is gotten worsen over those 2 weeks. Worse with sitting up or palpation. Patient denies any injury. Saw PCP and given script for azithro.  Patient did not start them, here for worsening.  Denies sob, diaphoresis, nausea.  Past Medical History  Diagnosis Date  . Depression   . GERD (gastroesophageal reflux disease)   . Anger   . Collapsed lung    History reviewed. No pertinent past surgical history. Family History  Problem Relation Age of Onset  . Cancer Father 11    lung cancer   Social History  Substance Use Topics  . Smoking status: Never Smoker   . Smokeless tobacco: Current User    Types: Chew  . Alcohol Use: 4.2 oz/week    7 Cans of beer per week     Comment: every other day     Review of Systems  Constitutional: Negative for fever and chills.  HENT: Negative for congestion and facial swelling.   Eyes: Negative for discharge and visual disturbance.  Respiratory: Positive for cough. Negative for shortness of breath.   Cardiovascular: Negative for chest pain and palpitations.  Gastrointestinal: Negative for vomiting, abdominal pain and diarrhea.  Musculoskeletal: Negative for myalgias and arthralgias.  Skin: Negative for color change and rash.  Neurological: Negative for tremors, syncope and headaches.  Psychiatric/Behavioral: Negative for confusion and dysphoric mood.      Allergies  Review of patient's allergies indicates no known allergies.  Home Medications   Prior to Admission medications   Medication Sig Start Date End Date Taking? Authorizing Provider  losartan-hydrochlorothiazide (HYZAAR) 50-12.5 MG tablet Take 1 tablet by mouth daily. 08/16/15  Yes Denita Lung, MD  Multiple Vitamin (MULTIVITAMIN WITH MINERALS) TABS tablet Take 1 tablet by mouth daily.   Yes Historical Provider, MD  sertraline (ZOLOFT) 50 MG tablet TAKE 1 TABLET (50 MG TOTAL) BY MOUTH DAILY. Patient taking differently: Take 50 mg by mouth daily.  08/16/15  Yes Denita Lung, MD  azithromycin (ZITHROMAX) 500 MG tablet Take 1 tablet (500 mg total) by mouth daily. 08/16/15   Denita Lung, MD  terbinafine (LAMISIL) 250 MG tablet TAKE 1 TABLET (250 MG TOTAL) BY MOUTH DAILY. Patient not taking: Reported on 08/16/2015 08/20/14   Denita Lung, MD   BP 127/99 mmHg  Pulse 71  Temp(Src) 98.6 F (37 C) (Oral)  Resp 16  SpO2 98% Physical Exam  Constitutional: He is oriented to person, place, and time. He appears well-developed and well-nourished.  HENT:  Head: Normocephalic and atraumatic.  Eyes: EOM are normal. Pupils are equal, round, and reactive to light.  Neck: Normal range of motion. Neck supple. No JVD present.  Cardiovascular: Normal rate and regular rhythm.  Exam reveals  no gallop and no friction rub.   No murmur heard. Pulmonary/Chest: No respiratory distress. He has no wheezes. He exhibits tenderness (patient tender to specific point right at the rib attachment to the sternum about ribs 5).  Abdominal: He exhibits no distension. There is no rebound and no guarding.  Musculoskeletal: Normal range of motion.  Neurological: He is alert and oriented to person, place, and time.  Skin: No rash noted. No pallor.  Psychiatric: He has a normal mood and affect. His behavior is normal.  Nursing note and vitals reviewed.   ED Course  Procedures (including critical care time) Labs Review Labs Reviewed - No data to display  Imaging Review Dg Chest 2 View  08/18/2015  CLINICAL DATA:  Productive cough and chest pain for 2 weeks. EXAM: CHEST  2 VIEW COMPARISON:  None. FINDINGS: The heart size and mediastinal contours are within normal limits. Both lungs are clear. No evidence of pneumothorax or pleural effusion. Several old left rib fracture deformities incidentally noted. IMPRESSION: No active cardiopulmonary disease. Electronically Signed   By: Earle Gell M.D.   On: 08/18/2015 17:35   I have personally reviewed and evaluated these images and lab results as part of my medical decision-making.   EKG Interpretation   Date/Time:  Wednesday August 18 2015 13:50:42 EST Ventricular Rate:  73 PR Interval:  163 QRS Duration: 111 QT Interval:  399 QTC Calculation: 440 R Axis:   -52 Text Interpretation:  Sinus rhythm Abnormal R-wave progression, early  transition No old tracing to compare Confirmed by Overton Boggus MD, DANIEL [phone])  on 08/18/2015 4:39:25 PM      MDM   Final diagnoses:  Chest pain, unspecified chest pain type    49 yo M with a cc of chest pain. Musculoskeletal with history of physical. Doubt atypical presentation of ACS at this point. Wells 0, doubt PE.  Likely secondary to URI.  CXR to rule out pna.   Chest x-ray negative for pneumonia. We'll  discharge the patient home. NSAIDs for pain. PCP follow-up.  11:09 PM:  I have discussed the diagnosis/risks/treatment options with the patient and family and believe the pt to be eligible for discharge home to follow-up with PCP. We also discussed returning to the ED immediately if new or worsening sx occur. We discussed the sx which are most concerning (e.g., sudden worsening pain, fever, exertional pain) that necessitate immediate return. Medications administered to the patient during their visit and any new prescriptions provided to the patient are listed below.  Medications given during this visit Medications  ibuprofen (ADVIL,MOTRIN) tablet 800 mg (800 mg Oral Given 08/18/15 1724)  acetaminophen (TYLENOL) tablet 1,000 mg (1,000 mg Oral Given 08/18/15 1724)    Discharge Medication List as of 08/18/2015  5:40 PM      The patient appears reasonably screen and/or stabilized for discharge and I doubt any other medical condition or other [facility] requiring further screening, evaluation, or treatment in the ED at this time prior to discharge.     Deno Etienne, DO 08/18/15 2309

## 2015-08-18 NOTE — ED Notes (Signed)
INITIAL ASSESSMENT COMPLETED. PT C/O MID-STERNAL CHEST PAIN MORE WHEN MOVING AROUND AND COUGHING X1 WEEK. PT WAS SEEN BY HIS PMD AND GIVEN RX FOR ABX, BUT HE DID NOT FILL THEM. AWAITING FURTHER ORDERS.

## 2015-08-18 NOTE — Discharge Instructions (Signed)
Take 4 over the counter ibuprofen tablets 3 times a day or 2 over-the-counter naproxen tablets twice a day for pain.  Return for sudden worsening pain shortness breath, fever or pain worse on exertion Chest Wall Pain Chest wall pain is pain in or around the bones and muscles of your chest. Sometimes, an injury causes this pain. Sometimes, the cause may not be known. This pain may take several weeks or longer to get better. HOME CARE INSTRUCTIONS  Pay attention to any changes in your symptoms. Take these actions to help with your pain:   Rest as told by your health care provider.   Avoid activities that cause pain. These include any activities that use your chest muscles or your abdominal and side muscles to lift heavy items.   If directed, apply ice to the painful area:  Put ice in a plastic bag.  Place a towel between your skin and the bag.  Leave the ice on for 20 minutes, 2-3 times per day.  Take over-the-counter and prescription medicines only as told by your health care provider.  Do not use tobacco products, including cigarettes, chewing tobacco, and e-cigarettes. If you need help quitting, ask your health care provider.  Keep all follow-up visits as told by your health care provider. This is important. SEEK MEDICAL CARE IF:  You have a fever.  Your chest pain becomes worse.  You have new symptoms. SEEK IMMEDIATE MEDICAL CARE IF:  You have nausea or vomiting.  You feel sweaty or light-headed.  You have a cough with phlegm (sputum) or you cough up blood.  You develop shortness of breath.   This information is not intended to replace advice given to you by your health care provider. Make sure you discuss any questions you have with your health care provider.   Document Released: 08/28/2005 Document Revised: 05/19/2015 Document Reviewed: 11/23/2014 Elsevier Interactive Patient Education [employer].

## 2015-09-13 ENCOUNTER — Other Ambulatory Visit: Payer: Self-pay | Admitting: Family Medicine

## 2016-09-25 ENCOUNTER — Telehealth: Payer: Self-pay

## 2016-09-25 DIAGNOSIS — F341 Dysthymic disorder: Secondary | ICD-10-CM

## 2016-09-25 DIAGNOSIS — I10 Essential (primary) hypertension: Secondary | ICD-10-CM

## 2016-09-25 NOTE — Telephone Encounter (Signed)
Dr.lalonde is this okay to refill

## 2016-09-25 NOTE — Telephone Encounter (Signed)
He has not been seen in over a year. He needs to schedule appointment but do not let him run out

## 2016-09-25 NOTE — Telephone Encounter (Signed)
Called both numbers pt is not available

## 2016-09-25 NOTE — Telephone Encounter (Signed)
Fax rcvd requesting refills for pt to [facility] on Sertraline and losartan- HCTZ. Victor[place] December

## 2016-09-26 MED ORDER — LOSARTAN POTASSIUM-HCTZ 50-12.5 MG PO TABS: 1.0000 | ORAL_TABLET | Freq: Every day | ORAL | 0 refills | Status: DC

## 2016-09-26 MED ORDER — SERTRALINE HCL 50 MG PO TABS: ORAL_TABLET | ORAL | 0 refills | Status: DC

## 2016-09-26 NOTE — Telephone Encounter (Signed)
Make sure he did not run out of medication

## 2016-09-26 NOTE — Addendum Note (Signed)
Addended by: Arley Phenix L on: 09/26/2016 02:46 PM   Modules accepted: Orders

## 2016-09-26 NOTE — Telephone Encounter (Signed)
30 day supply called in for pt. Wife aware. Victor[place] December

## 2016-09-26 NOTE — Telephone Encounter (Signed)
Spoke with [name]- she was made aware- pt has appt Monday. She has concerns about pt's zoloft- she reports pt will get upset quickly and reports "manic depressive episodes." She wanted to make you aware as he may not be fully honest with you at appt. Robert Diaz

## 2016-10-02 ENCOUNTER — Other Ambulatory Visit: Payer: Self-pay | Admitting: Family Medicine

## 2016-10-02 ENCOUNTER — Ambulatory Visit (INDEPENDENT_AMBULATORY_CARE_PROVIDER_SITE_OTHER): Payer: PRIVATE HEALTH INSURANCE | Admitting: Family Medicine

## 2016-10-02 ENCOUNTER — Encounter: Payer: Self-pay | Admitting: Family Medicine

## 2016-10-02 VITALS — BP 150/100 | HR 80 | Wt 225.0 lb

## 2016-10-02 DIAGNOSIS — Z23 Encounter for immunization: Secondary | ICD-10-CM

## 2016-10-02 DIAGNOSIS — I10 Essential (primary) hypertension: Secondary | ICD-10-CM

## 2016-10-02 DIAGNOSIS — R7302 Impaired glucose tolerance (oral): Secondary | ICD-10-CM | POA: Diagnosis not present

## 2016-10-02 DIAGNOSIS — L989 Disorder of the skin and subcutaneous tissue, unspecified: Secondary | ICD-10-CM | POA: Diagnosis not present

## 2016-10-02 DIAGNOSIS — F341 Dysthymic disorder: Secondary | ICD-10-CM

## 2016-10-02 DIAGNOSIS — Z1159 Encounter for screening for other viral diseases: Secondary | ICD-10-CM

## 2016-10-02 LAB — LIPID PANEL
CHOL/HDL RATIO: 3.7 ratio (ref <5.0)
Cholesterol: 175 mg/dL (ref <200)
HDL: 47 mg/dL (ref >40)
LDL CALC: 109 mg/dL — AB (ref <100)
Triglycerides: 94 mg/dL (ref <150)
VLDL: 19 mg/dL (ref <30)

## 2016-10-02 LAB — CBC WITH DIFFERENTIAL/PLATELET
BASOS PCT: 0 %
Basophils Absolute: 0 cells/uL (ref 0–200)
EOS PCT: 2 %
Eosinophils Absolute: 120 cells/uL (ref 15–500)
HCT: 47 % (ref 38.5–50.0)
Hemoglobin: 15.7 g/dL (ref 13.2–17.1)
Lymphocytes Relative: 48 %
Lymphs Abs: 2880 cells/uL (ref 850–3900)
MCH: 29 pg (ref 27.0–33.0)
MCHC: 33.4 g/dL (ref 32.0–36.0)
MCV: 86.7 fL (ref 80.0–100.0)
MONOS PCT: 5 %
MPV: 8.9 fL (ref 7.5–12.5)
Monocytes Absolute: 300 cells/uL (ref 200–950)
NEUTROS ABS: 2700 {cells}/uL (ref 1500–7800)
Neutrophils Relative %: 45 %
PLATELETS: 216 10*3/uL (ref 140–400)
RBC: 5.42 MIL/uL (ref 4.20–5.80)
RDW: 14.1 % (ref 11.0–15.0)
WBC: 6 10*3/uL (ref 4.0–10.5)

## 2016-10-02 LAB — COMPREHENSIVE METABOLIC PANEL
ALT: 24 U/L (ref 9–46)
AST: 18 U/L (ref 10–35)
Albumin: 4.4 g/dL (ref 3.6–5.1)
Alkaline Phosphatase: 81 U/L (ref 40–115)
BUN: 15 mg/dL (ref 7–25)
CHLORIDE: 106 mmol/L (ref 98–110)
CO2: 23 mmol/L (ref 20–31)
CREATININE: 0.83 mg/dL (ref 0.70–1.33)
Calcium: 9.6 mg/dL (ref 8.6–10.3)
Glucose, Bld: 101 mg/dL — ABNORMAL HIGH (ref 65–99)
Potassium: 4.2 mmol/L (ref 3.5–5.3)
SODIUM: 141 mmol/L (ref 135–146)
TOTAL PROTEIN: 7.1 g/dL (ref 6.1–8.1)
Total Bilirubin: 0.3 mg/dL (ref 0.2–1.2)

## 2016-10-02 LAB — HEPATITIS C ANTIBODY: HCV AB: NEGATIVE

## 2016-10-02 MED ORDER — SERTRALINE HCL 100 MG PO TABS: 100.0000 mg | ORAL_TABLET | Freq: Every day | ORAL | 3 refills | Status: DC

## 2016-10-02 MED ORDER — LOSARTAN POTASSIUM-HCTZ 100-12.5 MG PO TABS: 1.0000 | ORAL_TABLET | Freq: Every day | ORAL | 3 refills | Status: DC

## 2016-10-02 NOTE — Progress Notes (Signed)
   Subjective:    Patient ID: Robert Diaz, male    DOB: Jul 03, 1966, 51 y.o.   MRN: [id number]  HPI He is here for an interval evaluation. He has not been here in over a year. He is taking losartan intermittently. He also is taking Zoloft and again is using this fairly regularly at the milligrams. This is mainly to help with anger management. His wife does state that he is still having difficulty with that. His work keeps him quite busy. His marriage seem to be fairly stable. Review of the record indicates he does have glucose intolerance. He is made no real changes in his diet or exercise. Does have a lesion on his back that he would like further evaluated. His wife states that it has changed.  Review of Systems     Objective:   Physical Exam Alert and in no distress. Tympanic membranes and canals are normal. Pharyngeal area is normal. Neck is supple without adenopathy or thyromegaly. Cardiac exam shows a regular sinus rhythm without murmurs or gallops. Lungs are clear to auscultation. Exam of the back does show a irregularly-shaped lesion with irregular pattern of pigmentation in the right mid back area.       Assessment & Plan:  Dysthymia - Plan: sertraline (ZOLOFT) 100 MG tablet  Essential hypertension - Plan: losartan-hydrochlorothiazide (HYZAAR) 100-12.5 MG tablet, CBC with Differential/Platelet, Comprehensive metabolic panel  Glucose intolerance (impaired glucose tolerance) - Plan: CBC with Differential/Platelet, Comprehensive metabolic panel, Lipid panel  Skin lesion  Need for prophylactic vaccination and inoculation against influenza - Plan: Flu Vaccine QUAD 36+ mos IM  Need for prophylactic vaccination with combined diphtheria-tetanus-pertussis (DTP) vaccine - Plan: Tdap vaccine greater than or equal to 7yo IM  Need for hepatitis C screening test - Plan: Hepatitis C antibody Discussed the Zoloft. I will increase it to 100 mg. He is not at all interested in getting involved  in counseling to help find out where the anger comes from. I will continue to work with him concerning this. We'll also increase his losartan and do routine blood screening on him. His immunizations were updated. Lesion on his back is quite concerning for melanoma. A punch biopsy was taken. He tolerated that well. Is to return here in one month.

## 2016-10-04 ENCOUNTER — Telehealth: Payer: Self-pay

## 2016-10-04 DIAGNOSIS — C439 Malignant melanoma of skin, unspecified: Secondary | ICD-10-CM | POA: Insufficient documentation

## 2016-10-04 NOTE — Telephone Encounter (Signed)
[facility] pathology called to give report on pt biopsy Malignant Melanoma 0.5 17mm Level 2 pref. Margin positive  Deep margin free.     Dr.lalonde has called patient and has informed him of results he verbalized understanding   Dr.Lalonde wants him set up dermatology I will call Lyndle Herrlich.

## 2016-10-04 NOTE — Telephone Encounter (Signed)
Pt has appointment with [facility] Feb 5 th @ 2:30

## 2016-10-30 ENCOUNTER — Ambulatory Visit (INDEPENDENT_AMBULATORY_CARE_PROVIDER_SITE_OTHER): Payer: PRIVATE HEALTH INSURANCE | Admitting: Family Medicine

## 2016-10-30 ENCOUNTER — Encounter: Payer: Self-pay | Admitting: Family Medicine

## 2016-10-30 VITALS — BP 150/100 | HR 77 | Wt 223.0 lb

## 2016-10-30 DIAGNOSIS — C439 Malignant melanoma of skin, unspecified: Secondary | ICD-10-CM | POA: Diagnosis not present

## 2016-10-30 DIAGNOSIS — I10 Essential (primary) hypertension: Secondary | ICD-10-CM

## 2016-10-30 DIAGNOSIS — F418 Other specified anxiety disorders: Secondary | ICD-10-CM | POA: Diagnosis not present

## 2016-10-30 DIAGNOSIS — F341 Dysthymic disorder: Secondary | ICD-10-CM

## 2016-10-30 MED ORDER — ALPRAZOLAM 0.25 MG PO TABS: 0.2500 mg | ORAL_TABLET | Freq: Two times a day (BID) | ORAL | 0 refills | Status: DC | PRN

## 2016-10-30 NOTE — Progress Notes (Signed)
   Subjective:    Patient ID: Robert Diaz, male    DOB: 11/29/1965, 51 y.o.   MRN: [id number]  HPI he is here for a recheck. He is scheduled for further surgery concerning the malignant melanoma within the next several days. This does have him quite anxious but he seems to be doing fairly well. He continues on his Zoloft which seems be doing a fairly decent job. He is now on Hyzaar 100/12.5.  Review of Systems     Objective:   Physical Exam Alert and in no distress but with a definite stressed look on his face.       Assessment & Plan:  Anxiety about health - Plan: ALPRAZolam (XANAX) 0.25 MG tablet  Essential hypertension  Dysthymia  Melanoma of skin ([place]) I discussed his blood pressure with him and it is still elevated however considering the stress that he is under, I will wait 1 more month for this. I will also give him Xanax to help with the anxiety concerning this. Continue on his Zoloft.

## 2016-11-01 ENCOUNTER — Other Ambulatory Visit: Payer: Self-pay | Admitting: Dermatology

## 2017-01-17 ENCOUNTER — Encounter: Payer: Self-pay | Admitting: Family Medicine

## 2017-01-17 ENCOUNTER — Ambulatory Visit (INDEPENDENT_AMBULATORY_CARE_PROVIDER_SITE_OTHER): Payer: PRIVATE HEALTH INSURANCE | Admitting: Family Medicine

## 2017-01-17 VITALS — BP 120/80 | HR 86 | Temp 98.1°F | Wt 214.0 lb

## 2017-01-17 DIAGNOSIS — K529 Noninfective gastroenteritis and colitis, unspecified: Secondary | ICD-10-CM

## 2017-01-17 MED ORDER — ONDANSETRON HCL 4 MG PO TABS: 4.0000 mg | ORAL_TABLET | Freq: Three times a day (TID) | ORAL | 0 refills | Status: AC | PRN

## 2017-01-17 NOTE — Progress Notes (Signed)
   Subjective:    Patient ID: Robert Diaz, male    DOB: March 10, 1966, 51 y.o.   MRN: [id number]  HPI He complains of the onset of nausea, vomiting, diarrhea earlier today. But no fever, chills, blood in stool. No one else in his family has had this. Where he works at have been a few people sick but no definite outbreak. He does work in [employer].   Review of Systems     Objective:   Physical Exam Alert and looking slightly toxic. Cardiac and lung exam normal. Abdominal exam shows active bowel sounds without masses or tenderness.       Assessment & Plan:  Acute gastroenteritis - Plan: ondansetron (ZOFRAN) 4 MG tablet  Recommend he use Zofran and then sips of liquids to keep himself hydrated. He can also use Imodium on an as-needed basis for the diarrhea. Reassured him that this usually goes away with time.

## 2017-01-17 NOTE — Patient Instructions (Signed)

## 2017-03-12 ENCOUNTER — Other Ambulatory Visit: Payer: Self-pay

## 2017-03-12 ENCOUNTER — Telehealth: Payer: Self-pay

## 2017-03-12 DIAGNOSIS — F341 Dysthymic disorder: Secondary | ICD-10-CM

## 2017-03-12 MED ORDER — SERTRALINE HCL 100 MG PO TABS: 100.0000 mg | ORAL_TABLET | Freq: Every day | ORAL | 3 refills | Status: DC

## 2017-03-12 NOTE — Telephone Encounter (Signed)
ok 

## 2017-03-12 NOTE — Telephone Encounter (Signed)
Is this okay to refill? 

## 2017-03-12 NOTE — Telephone Encounter (Signed)
Fax request for refills of Sertraline to eb sent top [facility]. Robert Diaz

## 2017-03-12 NOTE — Telephone Encounter (Signed)
Med sent in.

## 2017-08-06 ENCOUNTER — Telehealth: Payer: Self-pay | Admitting: Family Medicine

## 2017-08-06 DIAGNOSIS — F341 Dysthymic disorder: Secondary | ICD-10-CM

## 2017-08-06 MED ORDER — SERTRALINE HCL 100 MG PO TABS: 100.0000 mg | ORAL_TABLET | Freq: Every day | ORAL | 1 refills | Status: DC

## 2017-08-06 NOTE — Telephone Encounter (Signed)
Rcvd refill request for Sertraline 100 mg #30 from [facility] at [facility]

## 2017-09-20 IMAGING — CR DG CHEST 2V
2 series · 2 of 2 positions shown · non-contrast
Comparison: None.

CLINICAL DATA: Productive cough and chest pain for 2 weeks.

EXAM:
CHEST  2 VIEW

[w chest pa]
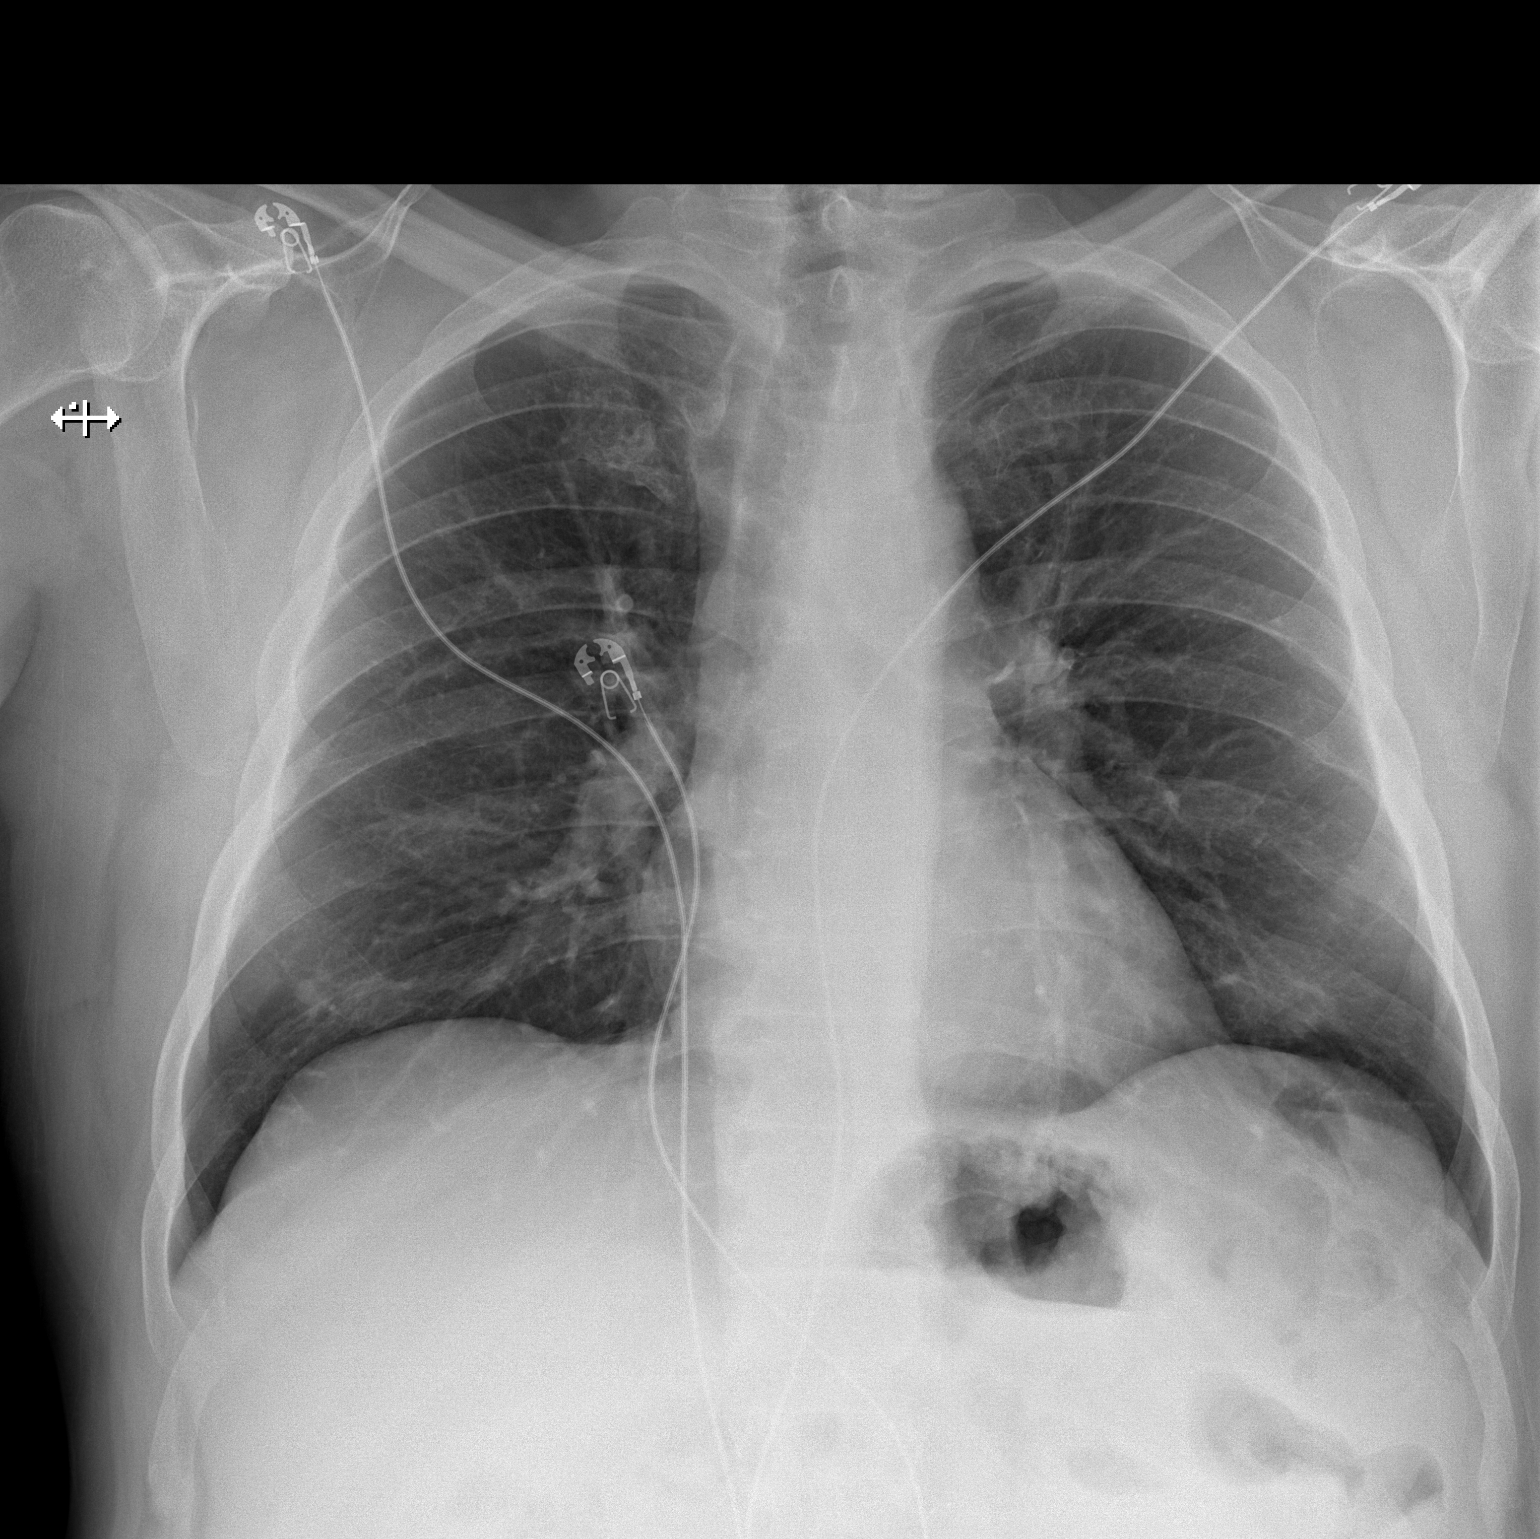

[w chest lat]
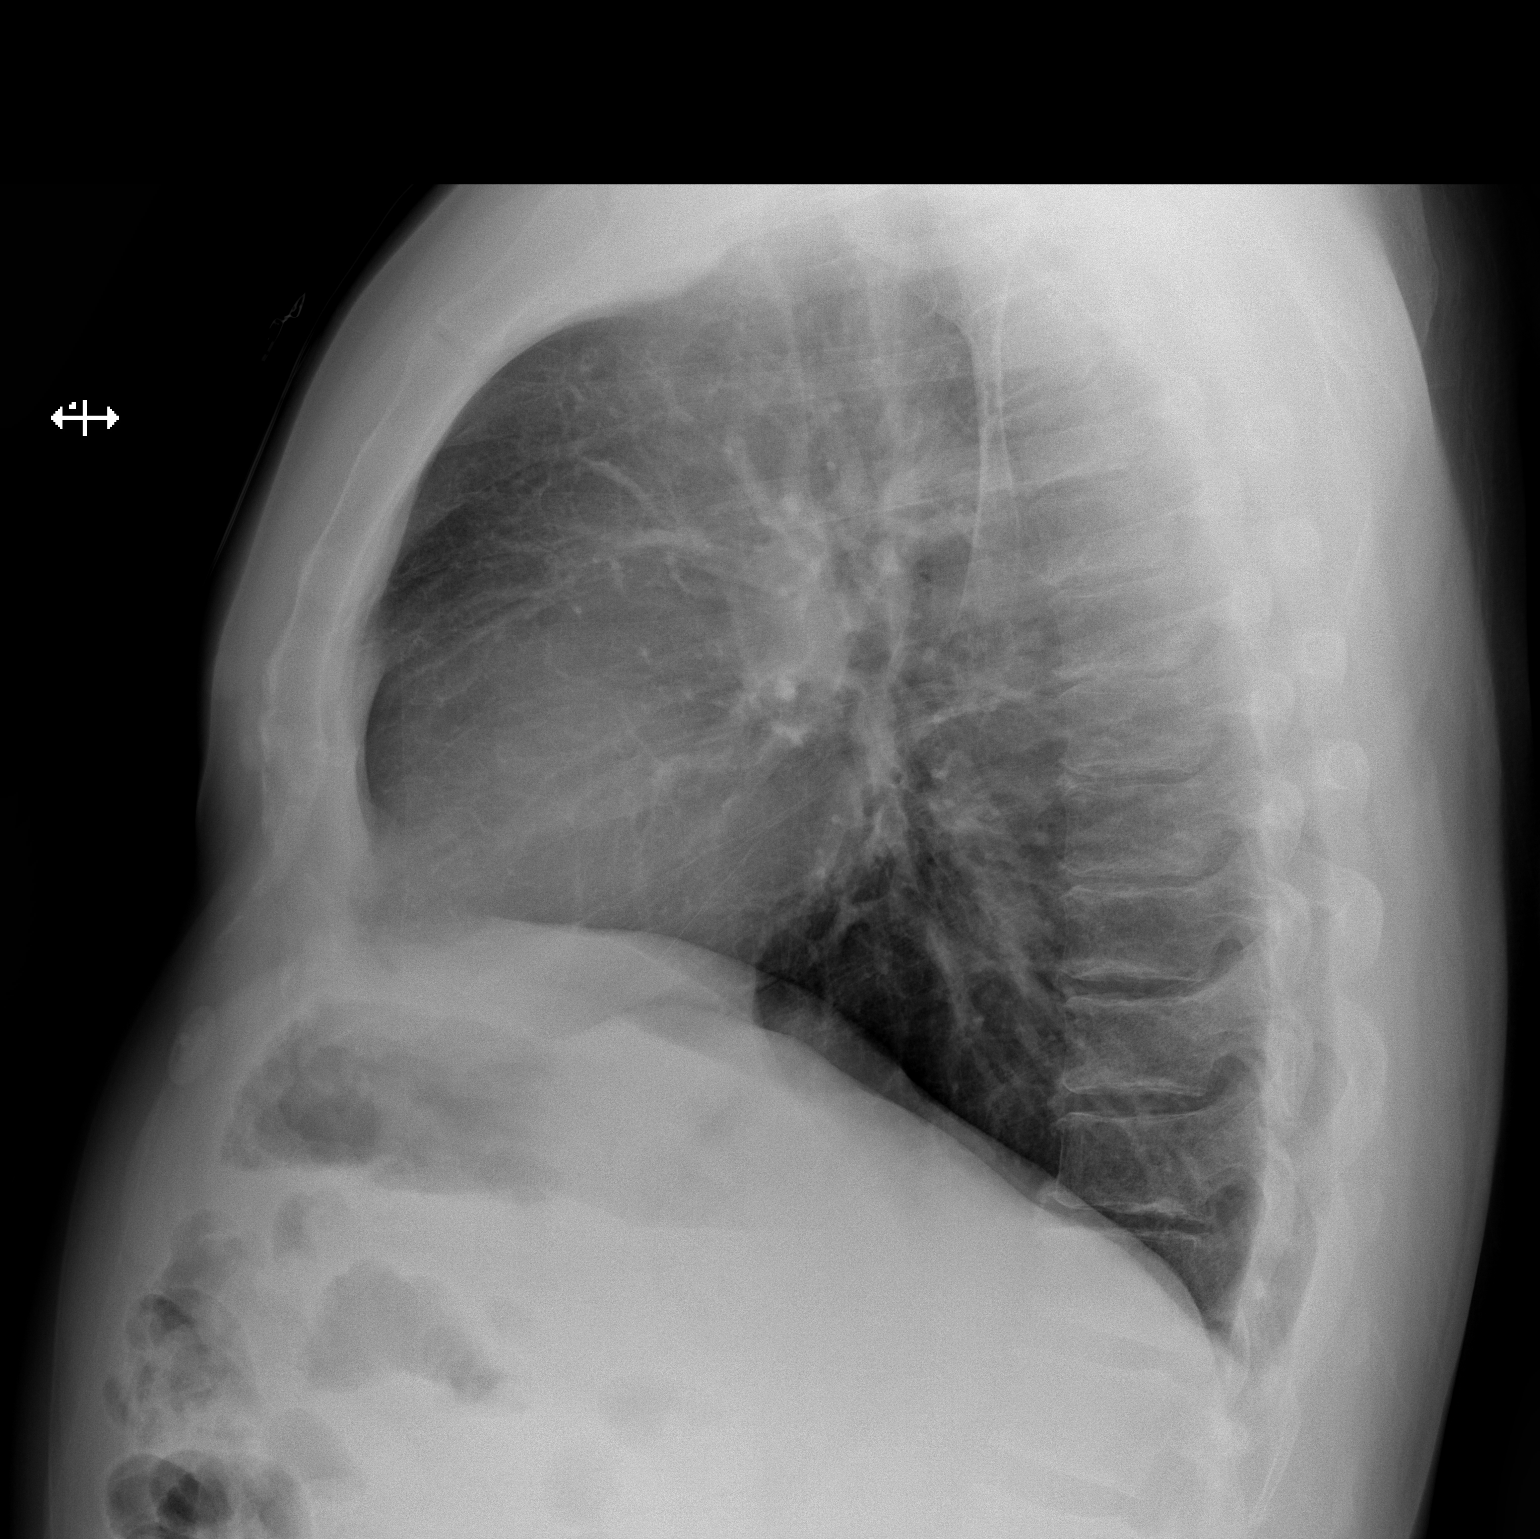

[2 of 2 positions shown; findings below may reference images not displayed]

FINDINGS: The heart size and mediastinal contours are within normal limits.
Both lungs are clear. No evidence of pneumothorax or pleural
effusion. Several old left rib fracture deformities incidentally
noted.
IMPRESSION: No active cardiopulmonary disease.

## 2017-10-20 ENCOUNTER — Other Ambulatory Visit: Payer: Self-pay | Admitting: Family Medicine

## 2017-10-20 DIAGNOSIS — F341 Dysthymic disorder: Secondary | ICD-10-CM

## 2017-10-20 DIAGNOSIS — I10 Essential (primary) hypertension: Secondary | ICD-10-CM

## 2017-10-22 NOTE — Telephone Encounter (Signed)
Is this ok to refill?  

## 2017-10-22 NOTE — Telephone Encounter (Signed)
Called pt to set up med check. No answer and could not leave message due to voicemail not being set up. Need approval on zoloft. THANKS [place]

## 2017-10-22 NOTE — Telephone Encounter (Signed)
Have him schedule a med check appointment

## 2017-12-28 ENCOUNTER — Other Ambulatory Visit: Payer: Self-pay | Admitting: Family Medicine

## 2017-12-28 DIAGNOSIS — F341 Dysthymic disorder: Secondary | ICD-10-CM

## 2017-12-31 NOTE — Telephone Encounter (Signed)
[facility] is requesting to fill pt zoloft. Please advise [facility]  

## 2018-01-02 ENCOUNTER — Telehealth: Payer: Self-pay

## 2018-01-02 NOTE — Telephone Encounter (Signed)
Called pt to schedule an appt  Per Dr. Redmond School. No answer and voice mail not set up. [facility] 01-02-18

## 2018-02-05 ENCOUNTER — Other Ambulatory Visit: Payer: Self-pay | Admitting: Family Medicine

## 2018-02-05 DIAGNOSIS — I10 Essential (primary) hypertension: Secondary | ICD-10-CM

## 2018-02-05 DIAGNOSIS — F341 Dysthymic disorder: Secondary | ICD-10-CM

## 2018-03-04 ENCOUNTER — Ambulatory Visit: Payer: PRIVATE HEALTH INSURANCE | Admitting: Family Medicine

## 2018-03-04 ENCOUNTER — Encounter: Payer: Self-pay | Admitting: Family Medicine

## 2018-03-04 VITALS — BP 138/82 | HR 81 | Temp 98.3°F | Wt 221.4 lb

## 2018-03-04 DIAGNOSIS — F418 Other specified anxiety disorders: Secondary | ICD-10-CM | POA: Diagnosis not present

## 2018-03-04 DIAGNOSIS — Z86010 Personal history of colonic polyps: Secondary | ICD-10-CM

## 2018-03-04 DIAGNOSIS — R7302 Impaired glucose tolerance (oral): Secondary | ICD-10-CM

## 2018-03-04 DIAGNOSIS — I10 Essential (primary) hypertension: Secondary | ICD-10-CM | POA: Diagnosis not present

## 2018-03-04 DIAGNOSIS — C439 Malignant melanoma of skin, unspecified: Secondary | ICD-10-CM

## 2018-03-04 DIAGNOSIS — E669 Obesity, unspecified: Secondary | ICD-10-CM

## 2018-03-04 DIAGNOSIS — F341 Dysthymic disorder: Secondary | ICD-10-CM

## 2018-03-04 MED ORDER — LOSARTAN POTASSIUM-HCTZ 100-12.5 MG PO TABS: 1.0000 | ORAL_TABLET | Freq: Every day | ORAL | 3 refills | Status: DC

## 2018-03-04 MED ORDER — SERTRALINE HCL 100 MG PO TABS: 100.0000 mg | ORAL_TABLET | Freq: Every day | ORAL | 3 refills | Status: DC

## 2018-03-04 MED ORDER — ALPRAZOLAM 0.25 MG PO TABS: 0.2500 mg | ORAL_TABLET | Freq: Two times a day (BID) | ORAL | 0 refills | Status: DC | PRN

## 2018-03-04 NOTE — Progress Notes (Signed)
   Subjective:    Patient ID: Robert Diaz, male    DOB: Jun 14, 1966, 52 y.o.   MRN: [id number]  HPI He is here for an interval evaluation.  He continues on losartan and is having no difficulty with that.  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  He sometimes has difficulty dealing with them and what he considers her incompetence.  He also has a history of colonic polyps and is on a regular routine for follow-up on that.  Has a previous history of malignant melanoma and does need to follow-up with his dermatologist.  His work keeps him busy but he is not on a regular exercise plan.   Review of Systems     Objective:   Physical Exam Alert and in no distress. Tympanic membranes and canals are normal. Pharyngeal area is normal. Neck is supple without adenopathy or thyromegaly. Cardiac exam shows a regular sinus rhythm without murmurs or gallops. Lungs are clear to auscultation.        Assessment & Plan:  Dysthymia - Plan: sertraline (ZOLOFT) 100 MG tablet  Essential hypertension - Plan: losartan-hydrochlorothiazide (HYZAAR) 100-12.5 MG tablet  Glucose intolerance (impaired glucose tolerance) - Plan: CBC with Differential/Platelet, Comprehensive metabolic panel  Melanoma of skin (HCC)  Obesity (BMI 30-39.9) - Plan: CBC with Differential/Platelet, Comprehensive metabolic panel, Lipid panel  Anxiety about health - Plan: ALPRAZolam (XANAX) 0.25 MG tablet His blood pressure is elevated and I recommended that he get involved with a regular exercise program as well as cutting back on carbohydrates. BP recheck in approximately 1 month. Encouraged him to set up an appointment with his dermatologist. Discussed getting irritated with his employer using strongly encouraged him to take a different look at that.  Explained that he is getting them control over his anger.  He did seem to understand that.

## 2018-03-04 NOTE — Patient Instructions (Addendum)
20 minutes of something physical every day or 150 minutes a week total. Look at her carbohydrates specifically white food.  Cut her portions in half The next time you want to eat [place] ,take a Prilosec before you eat.

## 2018-03-05 LAB — CBC WITH DIFFERENTIAL/PLATELET
BASOS: 0 %
Basophils Absolute: 0 10*3/uL (ref 0.0–0.2)
EOS (ABSOLUTE): 0.1 10*3/uL (ref 0.0–0.4)
Eos: 2 %
HEMOGLOBIN: 15.9 g/dL (ref 13.0–17.7)
Hematocrit: 46.9 % (ref 37.5–51.0)
IMMATURE GRANS (ABS): 0 10*3/uL (ref 0.0–0.1)
IMMATURE GRANULOCYTES: 0 %
LYMPHS: 35 %
Lymphocytes Absolute: 2.4 10*3/uL (ref 0.7–3.1)
MCH: 29 pg (ref 26.6–33.0)
MCHC: 33.9 g/dL (ref 31.5–35.7)
MCV: 86 fL (ref 79–97)
MONOCYTES: 7 %
Monocytes Absolute: 0.5 10*3/uL (ref 0.1–0.9)
NEUTROS PCT: 56 %
Neutrophils Absolute: 3.8 10*3/uL (ref 1.4–7.0)
PLATELETS: 202 10*3/uL (ref 150–450)
RBC: 5.48 x10E6/uL (ref 4.14–5.80)
RDW: 14.6 % (ref 12.3–15.4)
WBC: 6.9 10*3/uL (ref 3.4–10.8)

## 2018-03-05 LAB — COMPREHENSIVE METABOLIC PANEL
A/G RATIO: 2 (ref 1.2–2.2)
ALT: 20 IU/L (ref 0–44)
AST: 16 IU/L (ref 0–40)
Albumin: 4.6 g/dL (ref 3.5–5.5)
Alkaline Phosphatase: 93 IU/L (ref 39–117)
BUN/Creatinine Ratio: 21 — ABNORMAL HIGH (ref 9–20)
BUN: 16 mg/dL (ref 6–24)
Bilirubin Total: 0.3 mg/dL (ref 0.0–1.2)
CALCIUM: 9.7 mg/dL (ref 8.7–10.2)
CO2: 22 mmol/L (ref 20–29)
Chloride: 107 mmol/L — ABNORMAL HIGH (ref 96–106)
Creatinine, Ser: 0.75 mg/dL — ABNORMAL LOW (ref 0.76–1.27)
GFR, EST AFRICAN AMERICAN: 121 mL/min/{1.73_m2} (ref >59)
GFR, EST NON AFRICAN AMERICAN: 105 mL/min/{1.73_m2} (ref >59)
GLUCOSE: 107 mg/dL — AB (ref 65–99)
Globulin, Total: 2.3 g/dL (ref 1.5–4.5)
Potassium: 5.1 mmol/L (ref 3.5–5.2)
Sodium: 144 mmol/L (ref 134–144)
TOTAL PROTEIN: 6.9 g/dL (ref 6.0–8.5)

## 2018-03-05 LAB — LIPID PANEL
CHOL/HDL RATIO: 3.3 ratio (ref 0.0–5.0)
Cholesterol, Total: 175 mg/dL (ref 100–199)
HDL: 53 mg/dL (ref >39)
LDL Calculated: 104 mg/dL — ABNORMAL HIGH (ref 0–99)
Triglycerides: 90 mg/dL (ref 0–149)
VLDL CHOLESTEROL CAL: 18 mg/dL (ref 5–40)

## 2018-04-09 ENCOUNTER — Other Ambulatory Visit: Payer: Self-pay | Admitting: Family Medicine

## 2018-04-09 ENCOUNTER — Telehealth: Payer: Self-pay | Admitting: Family Medicine

## 2018-04-09 DIAGNOSIS — F418 Other specified anxiety disorders: Secondary | ICD-10-CM

## 2018-04-09 NOTE — Telephone Encounter (Signed)
Pts wife called and states that when pt was here he was suppose to get a Oral RX for his toe fungus, states he has had that medicine before but was not sure what the name was.pt uses  [place], [address]. Pt can be reached at [phone]

## 2018-04-09 NOTE — Telephone Encounter (Signed)
Is this ok to refill?  

## 2018-04-09 NOTE — Telephone Encounter (Signed)
Call him and not his wife and discussed this with him.  I did not see any notes concerning treating this.

## 2018-04-10 MED ORDER — TERBINAFINE HCL 250 MG PO TABS: 250.0000 mg | ORAL_TABLET | Freq: Every day | ORAL | 0 refills | Status: AC

## 2018-04-10 NOTE — Telephone Encounter (Signed)
Pt says it was mentioned in his last office visit and never went back to during the visit. Pt was offered to make an appt to have toe looked at but he says he was also treated for this toe fungus in the past and the oral rx cleared it up. Please advise if an appt is necessary for pt to get the rx. [place]

## 2018-04-10 NOTE — Addendum Note (Signed)
Addended by: Denita Lung on: 04/10/2018 03:50 PM   Modules accepted: Orders

## 2018-05-08 ENCOUNTER — Ambulatory Visit: Payer: PRIVATE HEALTH INSURANCE | Admitting: Family Medicine

## 2018-06-03 ENCOUNTER — Encounter: Payer: Self-pay | Admitting: Family Medicine

## 2018-06-03 ENCOUNTER — Ambulatory Visit: Payer: PRIVATE HEALTH INSURANCE | Admitting: Family Medicine

## 2018-06-03 VITALS — BP 152/98 | HR 90 | Temp 97.8°F | Wt 224.0 lb

## 2018-06-03 DIAGNOSIS — R21 Rash and other nonspecific skin eruption: Secondary | ICD-10-CM | POA: Diagnosis not present

## 2018-06-03 NOTE — Progress Notes (Signed)
   Subjective:    Patient ID: Robert Diaz, male    DOB: 1966/09/09, 52 y.o.   MRN: [id number]  HPI He is here for evaluation of scattered lesion on his torso he is concerned about shingles.  They do itch but he is having no other trouble.   Review of Systems     Objective:   Physical Exam Scattered erythematous macular lesions are noted on his torso.  No lesions were noted on his arms, feet and specifically between his fingers.       Assessment & Plan:  Rash - Plan: Ambulatory referral to Dermatology I explained that I thought this was scabies but was not sure and will refer him to dermatology.

## 2018-07-17 ENCOUNTER — Other Ambulatory Visit: Payer: Self-pay | Admitting: Family Medicine

## 2018-07-17 DIAGNOSIS — F418 Other specified anxiety disorders: Secondary | ICD-10-CM

## 2018-07-18 NOTE — Telephone Encounter (Signed)
[facility] is requesting to fill pt xanax. Please advise [facility]

## 2018-10-12 ENCOUNTER — Other Ambulatory Visit: Payer: Self-pay | Admitting: Family Medicine

## 2018-10-12 DIAGNOSIS — F418 Other specified anxiety disorders: Secondary | ICD-10-CM

## 2018-10-14 NOTE — Telephone Encounter (Signed)
[facility] is requesting to fill pt xanax. Please advise [facility]

## 2018-10-22 ENCOUNTER — Ambulatory Visit: Payer: PRIVATE HEALTH INSURANCE | Admitting: Family Medicine

## 2018-10-23 ENCOUNTER — Encounter: Payer: Self-pay | Admitting: Family Medicine

## 2019-01-10 ENCOUNTER — Other Ambulatory Visit: Payer: Self-pay | Admitting: Family Medicine

## 2019-01-10 DIAGNOSIS — F418 Other specified anxiety disorders: Secondary | ICD-10-CM

## 2019-01-13 NOTE — Telephone Encounter (Signed)
[facility] is requesting to fill pt Xanax. Please advise. [place]

## 2019-02-10 ENCOUNTER — Other Ambulatory Visit: Payer: Self-pay | Admitting: Family Medicine

## 2019-02-10 DIAGNOSIS — F418 Other specified anxiety disorders: Secondary | ICD-10-CM

## 2019-02-10 NOTE — Telephone Encounter (Signed)
Is this okay to refill? 

## 2019-03-20 ENCOUNTER — Other Ambulatory Visit: Payer: Self-pay | Admitting: Family Medicine

## 2019-03-20 DIAGNOSIS — F341 Dysthymic disorder: Secondary | ICD-10-CM

## 2019-03-20 NOTE — Telephone Encounter (Signed)
[facility] is requesting to fill pt zoloft. [place]

## 2019-05-30 ENCOUNTER — Other Ambulatory Visit: Payer: Self-pay | Admitting: Family Medicine

## 2019-07-03 ENCOUNTER — Other Ambulatory Visit: Payer: Self-pay | Admitting: Family Medicine

## 2019-07-03 DIAGNOSIS — F418 Other specified anxiety disorders: Secondary | ICD-10-CM

## 2019-07-03 NOTE — Telephone Encounter (Signed)
Is this okay to refill? 

## 2019-11-28 ENCOUNTER — Other Ambulatory Visit: Payer: Self-pay | Admitting: Family Medicine

## 2020-04-09 ENCOUNTER — Other Ambulatory Visit: Payer: Self-pay | Admitting: Family Medicine

## 2020-04-09 DIAGNOSIS — F341 Dysthymic disorder: Secondary | ICD-10-CM

## 2020-04-09 NOTE — Telephone Encounter (Signed)
Pt called and made an appt please refill meds

## 2020-04-09 NOTE — Telephone Encounter (Signed)
Appt

## 2020-04-09 NOTE — Telephone Encounter (Signed)
[facility] is requesting to fill pt zolof. I sent him a message to advise of the need for and appt due to not being able to call. [place]

## 2020-04-12 NOTE — Telephone Encounter (Signed)
[facility] is requesting to fill pt zoloft. Pt has appt 05-05-20. [place]

## 2020-04-12 NOTE — Telephone Encounter (Signed)
Correction pt has appt -16-21. Please advise [facility]H

## 2020-04-26 ENCOUNTER — Other Ambulatory Visit: Payer: Self-pay

## 2020-04-26 ENCOUNTER — Ambulatory Visit: Payer: PRIVATE HEALTH INSURANCE | Admitting: Family Medicine

## 2020-04-26 VITALS — BP 152/86 | HR 88 | Temp 97.9°F | Wt 219.4 lb

## 2020-04-26 DIAGNOSIS — I10 Essential (primary) hypertension: Secondary | ICD-10-CM

## 2020-04-26 DIAGNOSIS — Z86010 Personal history of colonic polyps: Secondary | ICD-10-CM

## 2020-04-26 DIAGNOSIS — F418 Other specified anxiety disorders: Secondary | ICD-10-CM

## 2020-04-26 DIAGNOSIS — E669 Obesity, unspecified: Secondary | ICD-10-CM

## 2020-04-26 DIAGNOSIS — C439 Malignant melanoma of skin, unspecified: Secondary | ICD-10-CM

## 2020-04-26 DIAGNOSIS — F341 Dysthymic disorder: Secondary | ICD-10-CM

## 2020-04-26 MED ORDER — SERTRALINE HCL 100 MG PO TABS: 100.0000 mg | ORAL_TABLET | Freq: Every day | ORAL | 3 refills | Status: DC

## 2020-04-26 MED ORDER — ALPRAZOLAM 0.25 MG PO TABS: 0.2500 mg | ORAL_TABLET | Freq: Two times a day (BID) | ORAL | 1 refills | Status: DC | PRN

## 2020-04-26 MED ORDER — LOSARTAN POTASSIUM-HCTZ 100-12.5 MG PO TABS: 1.0000 | ORAL_TABLET | Freq: Every day | ORAL | 3 refills | Status: DC

## 2020-04-26 MED ORDER — AMLODIPINE BESYLATE 5 MG PO TABS: 5.0000 mg | ORAL_TABLET | Freq: Every day | ORAL | 3 refills | Status: DC

## 2020-04-26 NOTE — Progress Notes (Signed)
   Subjective:    Patient ID: Robert Diaz, male    DOB: 23-Jul-1966, 54 y.o.   MRN: [id number]  HPI He is here for a med check appointment.  He does have underlying dysthymia and is doing quite nicely on sertraline.  He does occasionally use Xanax and would like a refill on that.  Is also taking Hyzaar 100/12.5 and seems to be doing fairly well on that medication.  His weight is fairly stable.  He does follow-up regularly with dermatology for his underlying melanoma.  He also has a history of adenomatous colonic polyps and should be scheduled sometime soon for repeat colonoscopy.  He is here with his wife.  He has no other concerns or complaints.   Review of Systems     Objective:   Physical Exam Alert and in no distress. Tympanic membranes and canals are normal. Pharyngeal area is normal. Neck is supple without adenopathy or thyromegaly. Cardiac exam shows a regular sinus rhythm without murmurs or gallops. Lungs are clear to auscultation.        Assessment & Plan:   Essential hypertension - Plan: CBC with Differential/Platelet, Comprehensive metabolic panel, amLODipine (NORVASC) 5 MG tablet, losartan-hydrochlorothiazide (HYZAAR) 100-12.5 MG tablet, DISCONTINUED: losartan-hydrochlorothiazide (HYZAAR) 100-12.5 MG tablet  Hx of adenomatous colonic polyps  Melanoma of skin ([facility])  Obesity (BMI 30-39.9) - Plan: CBC with Differential/Platelet, Comprehensive metabolic panel, Lipid panel  Dysthymia - Plan: sertraline (ZOLOFT) 100 MG tablet  Anxiety about health - Plan: ALPRAZolam (XANAX) 0.25 MG tablet He will continue to follow-up with dermatology.  He seems to be doing quite nicely on his present sertraline dosing.  He is really not having anxiety about his health but would benefit from occasional use of Xanax.  Did discuss how to diffuse situations that makes him anxious.  He will follow-up with Dr. Benson Norway concerning the colonoscopy.  He is to return here in 1 month for recheck on his blood  pressure and bring his blood pressure cuff with him.  Over 30 minutes spent discussing all these issues with him and his wife.

## 2020-04-27 LAB — COMPREHENSIVE METABOLIC PANEL
ALT: 25 IU/L (ref 0–44)
AST: 20 IU/L (ref 0–40)
Albumin/Globulin Ratio: 2 (ref 1.2–2.2)
Albumin: 4.6 g/dL (ref 3.8–4.9)
Alkaline Phosphatase: 113 IU/L (ref 48–121)
BUN/Creatinine Ratio: 21 — ABNORMAL HIGH (ref 9–20)
BUN: 16 mg/dL (ref 6–24)
Bilirubin Total: 0.3 mg/dL (ref 0.0–1.2)
CO2: 24 mmol/L (ref 20–29)
Calcium: 9.6 mg/dL (ref 8.7–10.2)
Chloride: 104 mmol/L (ref 96–106)
Creatinine, Ser: 0.75 mg/dL — ABNORMAL LOW (ref 0.76–1.27)
GFR calc Af Amer: 119 mL/min/{1.73_m2} (ref >59)
GFR calc non Af Amer: 103 mL/min/{1.73_m2} (ref >59)
Globulin, Total: 2.3 g/dL (ref 1.5–4.5)
Glucose: 99 mg/dL (ref 65–99)
Potassium: 4.5 mmol/L (ref 3.5–5.2)
Sodium: 139 mmol/L (ref 134–144)
Total Protein: 6.9 g/dL (ref 6.0–8.5)

## 2020-04-27 LAB — LIPID PANEL
Chol/HDL Ratio: 3.7 ratio (ref 0.0–5.0)
Cholesterol, Total: 190 mg/dL (ref 100–199)
HDL: 52 mg/dL (ref >39)
LDL Chol Calc (NIH): 108 mg/dL — ABNORMAL HIGH (ref 0–99)
Triglycerides: 175 mg/dL — ABNORMAL HIGH (ref 0–149)
VLDL Cholesterol Cal: 30 mg/dL (ref 5–40)

## 2020-04-27 LAB — CBC WITH DIFFERENTIAL/PLATELET
Basophils Absolute: 0.1 10*3/uL (ref 0.0–0.2)
Basos: 1 %
EOS (ABSOLUTE): 0.1 10*3/uL (ref 0.0–0.4)
Eos: 2 %
Hematocrit: 48.3 % (ref 37.5–51.0)
Hemoglobin: 16.2 g/dL (ref 13.0–17.7)
Immature Grans (Abs): 0 10*3/uL (ref 0.0–0.1)
Immature Granulocytes: 0 %
Lymphocytes Absolute: 2.3 10*3/uL (ref 0.7–3.1)
Lymphs: 32 %
MCH: 29 pg (ref 26.6–33.0)
MCHC: 33.5 g/dL (ref 31.5–35.7)
MCV: 87 fL (ref 79–97)
Monocytes Absolute: 0.5 10*3/uL (ref 0.1–0.9)
Monocytes: 7 %
Neutrophils Absolute: 4.2 10*3/uL (ref 1.4–7.0)
Neutrophils: 58 %
Platelets: 242 10*3/uL (ref 150–450)
RBC: 5.58 x10E6/uL (ref 4.14–5.80)
RDW: 13.4 % (ref 11.6–15.4)
WBC: 7.2 10*3/uL (ref 3.4–10.8)

## 2020-05-31 ENCOUNTER — Ambulatory Visit: Payer: PRIVATE HEALTH INSURANCE | Admitting: Family Medicine

## 2020-06-16 ENCOUNTER — Telehealth: Payer: Self-pay

## 2020-06-16 NOTE — Telephone Encounter (Signed)
Called pt to advise we received a letter about his and wife will go to pharmacy and get med exchanged. Robert Diaz

## 2020-07-22 LAB — HM COLONOSCOPY

## 2020-08-12 ENCOUNTER — Encounter: Payer: Self-pay | Admitting: Family Medicine

## 2021-01-14 ENCOUNTER — Other Ambulatory Visit: Payer: Self-pay

## 2021-01-14 ENCOUNTER — Ambulatory Visit: Payer: PRIVATE HEALTH INSURANCE | Admitting: Family Medicine

## 2021-01-14 ENCOUNTER — Encounter: Payer: Self-pay | Admitting: Family Medicine

## 2021-01-14 VITALS — BP 132/72 | HR 85 | Temp 97.6°F | Ht 71.75 in | Wt 213.4 lb

## 2021-01-14 DIAGNOSIS — S46211A Strain of muscle, fascia and tendon of other parts of biceps, right arm, initial encounter: Secondary | ICD-10-CM

## 2021-01-14 DIAGNOSIS — Z23 Encounter for immunization: Secondary | ICD-10-CM | POA: Diagnosis not present

## 2021-01-14 NOTE — Progress Notes (Signed)
   Subjective:    Patient ID: Robert Diaz, male    DOB: May 21, 1966, 55 y.o.   MRN: [id number]  HPI He states that Sunday while throwing a relatively heavy object with his right arm and a softball pitching type motion he developed pain in the biceps area.  He noted swelling and discoloration.   Review of Systems     Objective:   Physical Exam Exam of the right arm shows full flexion of the elbow with good strength.  Swelling and ecchymosis noted in the biceps area.  Difficult to feel the muscle but no defect was palpable.       Assessment & Plan:  Strain of right biceps tendon  Immunization, viral disease - Plan: PFIZER Comirnaty(GRAY TOP)COVID-19 Vaccine  I explained that I thought he had a sprain rather than a true rupture.  He does have good strength and I therefore doubt to complete rupture.  Also discussed possible referral for further evaluation and potential surgical intervention.  Recommend relative rest, heat and I can recheck that in roughly a month.  He was comfortable with that.

## 2021-01-14 NOTE — Patient Instructions (Signed)
Heat for 20 minutes 3 times per day.  Listen to your body and if something hurts do not do it

## 2021-05-02 ENCOUNTER — Encounter: Payer: Self-pay | Admitting: Family Medicine

## 2021-05-04 ENCOUNTER — Encounter: Payer: Self-pay | Admitting: Family Medicine

## 2021-05-09 ENCOUNTER — Other Ambulatory Visit: Payer: Self-pay | Admitting: Family Medicine

## 2021-05-09 ENCOUNTER — Encounter: Payer: Self-pay | Admitting: Family Medicine

## 2021-05-09 DIAGNOSIS — F341 Dysthymic disorder: Secondary | ICD-10-CM

## 2021-05-09 DIAGNOSIS — I10 Essential (primary) hypertension: Secondary | ICD-10-CM

## 2021-06-15 ENCOUNTER — Other Ambulatory Visit: Payer: Self-pay | Admitting: Family Medicine

## 2021-06-15 DIAGNOSIS — I10 Essential (primary) hypertension: Secondary | ICD-10-CM

## 2021-06-15 DIAGNOSIS — F341 Dysthymic disorder: Secondary | ICD-10-CM

## 2021-06-15 NOTE — Telephone Encounter (Signed)
[facility] is requesting to fill pt zoloft. Please advise [facility]

## 2021-07-18 ENCOUNTER — Other Ambulatory Visit: Payer: Self-pay | Admitting: Family Medicine

## 2021-07-18 DIAGNOSIS — I10 Essential (primary) hypertension: Secondary | ICD-10-CM

## 2021-07-18 DIAGNOSIS — F341 Dysthymic disorder: Secondary | ICD-10-CM

## 2021-07-19 ENCOUNTER — Other Ambulatory Visit: Payer: Self-pay

## 2021-07-19 DIAGNOSIS — I10 Essential (primary) hypertension: Secondary | ICD-10-CM

## 2021-07-19 DIAGNOSIS — F341 Dysthymic disorder: Secondary | ICD-10-CM

## 2021-07-19 NOTE — Telephone Encounter (Signed)
Patients wife called stating pt is on fishing trip and ran out of medication for pended refils. Scheduled appt on 08/01/21 routing due to one medication being depression medication

## 2021-07-19 NOTE — Telephone Encounter (Signed)
Called pt to schedule and also sent two my chart messages advising pt to schedule a med check appointment. No reply and vm not taking messages. Will refuse refills but need you to refuse zoloft. Thanks [employer]

## 2021-07-20 ENCOUNTER — Other Ambulatory Visit: Payer: Self-pay

## 2021-07-20 DIAGNOSIS — I10 Essential (primary) hypertension: Secondary | ICD-10-CM

## 2021-07-20 DIAGNOSIS — F341 Dysthymic disorder: Secondary | ICD-10-CM

## 2021-07-20 MED ORDER — AMLODIPINE BESYLATE 5 MG PO TABS: 5.0000 mg | ORAL_TABLET | Freq: Every day | ORAL | 0 refills | Status: DC

## 2021-07-20 MED ORDER — LOSARTAN POTASSIUM-HCTZ 100-12.5 MG PO TABS: 1.0000 | ORAL_TABLET | Freq: Every day | ORAL | 0 refills | Status: DC

## 2021-07-20 NOTE — Telephone Encounter (Signed)
Patients wife called stating pt is on fishing trip and ran out of medication for pended refils. Scheduled appt on 08/01/21 routing due to one medication being depression medication

## 2021-08-01 ENCOUNTER — Ambulatory Visit: Payer: PRIVATE HEALTH INSURANCE | Admitting: Family Medicine

## 2021-08-15 ENCOUNTER — Encounter: Payer: PRIVATE HEALTH INSURANCE | Admitting: Family Medicine

## 2021-08-15 DIAGNOSIS — Z0000 Encounter for general adult medical examination without abnormal findings: Secondary | ICD-10-CM

## 2021-08-23 ENCOUNTER — Encounter: Payer: Self-pay | Admitting: Family Medicine

## 2021-08-29 ENCOUNTER — Other Ambulatory Visit: Payer: Self-pay

## 2021-08-29 ENCOUNTER — Telehealth: Payer: Self-pay | Admitting: Family Medicine

## 2021-08-29 DIAGNOSIS — I10 Essential (primary) hypertension: Secondary | ICD-10-CM

## 2021-08-29 MED ORDER — AMLODIPINE BESYLATE 5 MG PO TABS: 5.0000 mg | ORAL_TABLET | Freq: Every day | ORAL | 0 refills | Status: DC

## 2021-08-29 MED ORDER — LOSARTAN POTASSIUM-HCTZ 100-12.5 MG PO TABS: 1.0000 | ORAL_TABLET | Freq: Every day | ORAL | 0 refills | Status: DC

## 2021-08-29 NOTE — Telephone Encounter (Signed)
Done [facility] 

## 2021-08-29 NOTE — Telephone Encounter (Signed)
Pt's wife called and made pt an appt. Informed is MUST show up for this appointment. Please refills bp meds to [facility].

## 2021-09-15 ENCOUNTER — Ambulatory Visit: Payer: PRIVATE HEALTH INSURANCE | Admitting: Family Medicine

## 2021-09-15 ENCOUNTER — Encounter: Payer: Self-pay | Admitting: Family Medicine

## 2021-09-15 ENCOUNTER — Other Ambulatory Visit: Payer: Self-pay

## 2021-09-15 VITALS — BP 148/90 | HR 83 | Temp 98.0°F | Wt 215.4 lb

## 2021-09-15 DIAGNOSIS — C439 Malignant melanoma of skin, unspecified: Secondary | ICD-10-CM

## 2021-09-15 DIAGNOSIS — I10 Essential (primary) hypertension: Secondary | ICD-10-CM | POA: Diagnosis not present

## 2021-09-15 DIAGNOSIS — F418 Other specified anxiety disorders: Secondary | ICD-10-CM

## 2021-09-15 DIAGNOSIS — Z86010 Personal history of colonic polyps: Secondary | ICD-10-CM

## 2021-09-15 DIAGNOSIS — Z13220 Encounter for screening for lipoid disorders: Secondary | ICD-10-CM

## 2021-09-15 DIAGNOSIS — Z23 Encounter for immunization: Secondary | ICD-10-CM

## 2021-09-15 DIAGNOSIS — D239 Other benign neoplasm of skin, unspecified: Secondary | ICD-10-CM

## 2021-09-15 MED ORDER — AMLODIPINE BESYLATE 5 MG PO TABS: 5.0000 mg | ORAL_TABLET | Freq: Every day | ORAL | 3 refills | Status: DC

## 2021-09-15 MED ORDER — LOSARTAN POTASSIUM-HCTZ 100-12.5 MG PO TABS: 1.0000 | ORAL_TABLET | Freq: Every day | ORAL | 3 refills | Status: DC

## 2021-09-15 MED ORDER — ALPRAZOLAM 0.25 MG PO TABS: 0.2500 mg | ORAL_TABLET | Freq: Two times a day (BID) | ORAL | 1 refills | Status: AC | PRN

## 2021-09-15 NOTE — Progress Notes (Signed)
° °  Subjective:    Patient ID: Robert Diaz, male    DOB: 11-Aug-1966, 56 y.o.   MRN: [id number]  HPI He is here for a med check appointment.  He stopped taking his Zoloft approximately 1 month ago due to pharmacy not filling it.  Apparently has been over a year since he had been seen for this.  He has done fairly well off medication but would like an occasional Xanax when he is under a lot more stress.  He does have a history of colonic polyps and is scheduled for routine follow-up on that.  Continues on amlodipine and losartan and have no difficulty with that.  Occasionally smoking marijuana.  He does have a lesion present on right forearm that he would like looked at.  It occurred after he got an insect bite to that area.  Review of Systems     Objective:   Physical Exam Alert and in no distress. Tympanic membranes and canals are normal. Pharyngeal area is normal. Neck is supple without adenopathy or thyromegaly. Cardiac exam shows a regular sinus rhythm without murmurs or gallops. Lungs are clear to auscultation. Exam of the right forearm does show a 1 cm raised slightly pigmented lesion.  Nontender.       Assessment & Plan:  Need for COVID-19 vaccine - Plan: Pfizer Covid-19 Vaccine Bivalent Booster  Need for influenza vaccination - Plan: Flu Vaccine QUAD 27mo+IM (Fluarix, Fluzone & Alfiuria Quad PF)  Melanoma of skin ([place])  Hx of adenomatous colonic polyps  Essential hypertension - Plan: CBC with Differential/Platelet, Comprehensive metabolic panel, losartan-hydrochlorothiazide (HYZAAR) 100-12.5 MG tablet, amLODipine (NORVASC) 5 MG tablet  Screening for lipid disorders - Plan: Lipid panel  Anxiety about health - Plan: ALPRAZolam (XANAX) 0.25 MG tablet  Dermatofibroma He will follow-up with gastroenterology as scheduled.  He is also going to see dermatology.  Explained that the dermatofibroma is not an issue. Then discussed the use of Xanax.  Since he has done fairly well off of  Zoloft, recommend that he use Xanax on an as-needed basis when he is stressed.

## 2021-09-16 LAB — COMPREHENSIVE METABOLIC PANEL
ALT: 32 IU/L (ref 0–44)
AST: 24 IU/L (ref 0–40)
Albumin/Globulin Ratio: 2.1 (ref 1.2–2.2)
Albumin: 5.3 g/dL — ABNORMAL HIGH (ref 3.8–4.9)
Alkaline Phosphatase: 121 IU/L (ref 44–121)
BUN/Creatinine Ratio: 14 (ref 9–20)
BUN: 12 mg/dL (ref 6–24)
Bilirubin Total: 0.9 mg/dL (ref 0.0–1.2)
CO2: 21 mmol/L (ref 20–29)
Calcium: 9.9 mg/dL (ref 8.7–10.2)
Chloride: 104 mmol/L (ref 96–106)
Creatinine, Ser: 0.86 mg/dL (ref 0.76–1.27)
Globulin, Total: 2.5 g/dL (ref 1.5–4.5)
Glucose: 102 mg/dL — ABNORMAL HIGH (ref 70–99)
Potassium: 4.5 mmol/L (ref 3.5–5.2)
Sodium: 143 mmol/L (ref 134–144)
Total Protein: 7.8 g/dL (ref 6.0–8.5)
eGFR: 101 mL/min/{1.73_m2} (ref >59)

## 2021-09-16 LAB — CBC WITH DIFFERENTIAL/PLATELET
Basophils Absolute: 0.1 10*3/uL (ref 0.0–0.2)
Basos: 1 %
EOS (ABSOLUTE): 0.1 10*3/uL (ref 0.0–0.4)
Eos: 2 %
Hematocrit: 49 % (ref 37.5–51.0)
Hemoglobin: 16.8 g/dL (ref 13.0–17.7)
Immature Grans (Abs): 0 10*3/uL (ref 0.0–0.1)
Immature Granulocytes: 0 %
Lymphocytes Absolute: 2.8 10*3/uL (ref 0.7–3.1)
Lymphs: 43 %
MCH: 29.1 pg (ref 26.6–33.0)
MCHC: 34.3 g/dL (ref 31.5–35.7)
MCV: 85 fL (ref 79–97)
Monocytes Absolute: 0.6 10*3/uL (ref 0.1–0.9)
Monocytes: 9 %
Neutrophils Absolute: 2.9 10*3/uL (ref 1.4–7.0)
Neutrophils: 45 %
Platelets: 237 10*3/uL (ref 150–450)
RBC: 5.77 x10E6/uL (ref 4.14–5.80)
RDW: 12.7 % (ref 11.6–15.4)
WBC: 6.5 10*3/uL (ref 3.4–10.8)

## 2021-09-16 LAB — LIPID PANEL
Chol/HDL Ratio: 3.9 ratio (ref 0.0–5.0)
Cholesterol, Total: 178 mg/dL (ref 100–199)
HDL: 46 mg/dL (ref >39)
LDL Chol Calc (NIH): 107 mg/dL — ABNORMAL HIGH (ref 0–99)
Triglycerides: 138 mg/dL (ref 0–149)
VLDL Cholesterol Cal: 25 mg/dL (ref 5–40)

## 2022-05-17 ENCOUNTER — Encounter: Payer: Self-pay | Admitting: Internal Medicine

## 2022-06-20 ENCOUNTER — Encounter: Payer: Self-pay | Admitting: Internal Medicine

## 2022-07-03 ENCOUNTER — Encounter: Payer: Self-pay | Admitting: Internal Medicine

## 2022-10-31 ENCOUNTER — Other Ambulatory Visit: Payer: Self-pay | Admitting: Family Medicine

## 2022-10-31 DIAGNOSIS — I10 Essential (primary) hypertension: Secondary | ICD-10-CM

## 2022-12-25 ENCOUNTER — Other Ambulatory Visit: Payer: Self-pay | Admitting: Family Medicine

## 2022-12-25 DIAGNOSIS — I10 Essential (primary) hypertension: Secondary | ICD-10-CM

## 2022-12-25 NOTE — Telephone Encounter (Signed)
Called pt but mailbox is full. Last seen 09/2021 for med check. Will deny med for him to call back

## 2023-11-06 ENCOUNTER — Encounter: Payer: Self-pay | Admitting: Internal Medicine
